# Patient Record
Sex: Male | Born: 1995 | Race: Black or African American | Hispanic: No | Marital: Single | Smoking: Never smoker
Health system: Southern US, Community
[De-identification: ages and names within clinical notes are randomized; demographics above are authoritative.]

## PROBLEM LIST (undated history)

## (undated) DIAGNOSIS — J45909 Unspecified asthma, uncomplicated: Secondary | ICD-10-CM

## (undated) DIAGNOSIS — S5321XA Traumatic rupture of right radial collateral ligament, initial encounter: Secondary | ICD-10-CM

---
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
## 2017-07-12 ENCOUNTER — Emergency Department (HOSPITAL_COMMUNITY)
Admission: EM | Admit: 2017-07-12 | Discharge: 2017-07-16 | Disposition: A | Discharge status: Other Acute Inpatient Hospital | Payer: BLUE CROSS/BLUE SHIELD | Attending: Emergency Medicine | Admitting: Emergency Medicine

## 2017-07-12 ENCOUNTER — Encounter (HOSPITAL_COMMUNITY): Payer: Self-pay | Admitting: Emergency Medicine

## 2017-07-12 DIAGNOSIS — R441 Visual hallucinations: Secondary | ICD-10-CM | POA: Diagnosis present

## 2017-07-12 DIAGNOSIS — J45909 Unspecified asthma, uncomplicated: Secondary | ICD-10-CM | POA: Insufficient documentation

## 2017-07-12 DIAGNOSIS — R443 Hallucinations, unspecified: Secondary | ICD-10-CM

## 2017-07-12 DIAGNOSIS — R440 Auditory hallucinations: Secondary | ICD-10-CM | POA: Diagnosis not present

## 2017-07-12 MED ORDER — ZIPRASIDONE MESYLATE 20 MG IM SOLR
20.0000 mg | Freq: Once | INTRAMUSCULAR | Status: AC
  Administered 2017-07-13: 20 mg via INTRAMUSCULAR
  Filled 2017-07-12: qty 20
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
## 2017-07-13 DIAGNOSIS — R441 Visual hallucinations: Secondary | ICD-10-CM | POA: Diagnosis not present

## 2017-07-13 LAB — COMPREHENSIVE METABOLIC PANEL
ALK PHOS: 78 U/L (ref 38–126)
ALT: 17 U/L (ref 17–63)
ANION GAP: 10 (ref 5–15)
AST: 29 U/L (ref 15–41)
Albumin: 4.3 g/dL (ref 3.5–5.0)
BUN: 8 mg/dL (ref 6–20)
CALCIUM: 9.3 mg/dL (ref 8.9–10.3)
CHLORIDE: 104 mmol/L (ref 101–111)
CO2: 23 mmol/L (ref 22–32)
CREATININE: 1.04 mg/dL (ref 0.61–1.24)
Glucose, Bld: 93 mg/dL (ref 65–99)
Potassium: 3.6 mmol/L (ref 3.5–5.1)
SODIUM: 137 mmol/L (ref 135–145)
Total Bilirubin: 0.8 mg/dL (ref 0.3–1.2)
Total Protein: 7.8 g/dL (ref 6.5–8.1)

## 2017-07-13 LAB — CBC
HCT: 39 % (ref 39.0–52.0)
Hemoglobin: 13 g/dL (ref 13.0–17.0)
MCH: 29 pg (ref 26.0–34.0)
MCHC: 33.3 g/dL (ref 30.0–36.0)
MCV: 87.1 fL (ref 78.0–100.0)
PLATELETS: 298 10*3/uL (ref 150–400)
RBC: 4.48 MIL/uL (ref 4.22–5.81)
RDW: 14.7 % (ref 11.5–15.5)
WBC: 9.7 10*3/uL (ref 4.0–10.5)

## 2017-07-13 LAB — RAPID URINE DRUG SCREEN, HOSP PERFORMED
Amphetamines: NOT DETECTED
Barbiturates: NOT DETECTED
Benzodiazepines: NOT DETECTED
COCAINE: NOT DETECTED
OPIATES: NOT DETECTED
Tetrahydrocannabinol: NOT DETECTED

## 2017-07-13 LAB — SALICYLATE LEVEL

## 2017-07-13 LAB — ETHANOL: ALCOHOL ETHYL (B): 108 mg/dL — AB (ref <5)

## 2017-07-13 LAB — ACETAMINOPHEN LEVEL: Acetaminophen (Tylenol), Serum: <10 ug/mL — ABNORMAL LOW (ref 10–30)

## 2017-07-13 MED ORDER — LORAZEPAM 1 MG PO TABS
1.0000 mg | ORAL_TABLET | ORAL | Status: AC | PRN
  Administered 2017-07-15: 1 mg via ORAL
  Filled 2017-07-13: qty 1

## 2017-07-13 MED ORDER — ZIPRASIDONE MESYLATE 20 MG IM SOLR: 20.0000 mg | Freq: Two times a day (BID) | INTRAMUSCULAR | Status: DC | PRN

## 2017-07-13 MED ORDER — STERILE WATER FOR INJECTION IJ SOLN
INTRAMUSCULAR | Status: AC
  Administered 2017-07-13: 07:00:00
  Filled 2017-07-13: qty 10

## 2017-07-13 NOTE — ED Notes (Signed)
Hand cuffs removed. Pt remains calm, mother at bedside along with sitter

## 2017-07-13 NOTE — Progress Notes (Signed)
One shirt  One pants  A pair of white socks

## 2017-07-13 NOTE — ED Provider Notes (Signed)
[facility]-EMERGENCY DEPT Provider Note   CSN: [id number] Arrival date & time: 07/12/17  2245     History   Chief Complaint Chief Complaint  Patient presents with  . Hallucinations    HPI Bobby [name] is a 21 y.o. male.  Patient presents to the emergency department with chief complaint of auditory and visual hallucinations. He is accompanied by his mother, who states that he has also had erratic behavior and has been destructive home. She states that he claims that he sees the devil and speaks with persons that are not present. She reports associated drug use, but states that his symptoms seem to be coming and going much more frequently without taking drugs. She is concerned for his safety and for the safety of others.  Level 5 caveat applies.   The history is provided by the patient. No language interpreter was used.    Past Medical History:  Diagnosis Date  . Asthma    prn inhaler  . Rupture of radial collateral ligament of right thumb 11/2015    There are no active problems to display for this patient.   Past Surgical History:  Procedure Laterality Date  . LIGAMENT REPAIR Right 12/15/2015   Procedure: RIGHT THUMB RADIAL COLLATERAL LIGAMENT REPAIR;  Surgeon: Betha LoaKevin Kuzma, MD;  Location: [facility];  Service: Orthopedics;  Laterality: Right;       Home Medications    Prior to Admission medications   Medication Sig Start Date End Date Taking? Authorizing Provider  albuterol (PROVENTIL HFA;VENTOLIN HFA) 108 (90 BASE) MCG/ACT inhaler Inhale into the lungs every 6 (six) hours as needed for wheezing or shortness of breath.    [provider]  HYDROcodone-acetaminophen [facility](NORCO) 5-325 MG tablet 1-2 tabs po q6 hours prn pain 12/15/15   Betha LoaKuzma, Kevin, MD    Family History No family history on file.  Social History Social History  Substance Use Topics  . Smoking status: Never Smoker  . Smokeless tobacco: Never Used  . Alcohol use No      Allergies   Patient has no known allergies.   Review of Systems Review of Systems  Unable to perform ROS: Psychiatric disorder     Physical Exam Updated Vital Signs BP 119/73 (BP Location: Right Arm)   Pulse 79   Temp 98.1 F (36.7 C) (Oral)   Resp 18   Wt 67.1 kg (148 lb)   SpO2 99%   Physical Exam  Constitutional: He is oriented to person, place, and time. He appears well-developed and well-nourished.  HENT:  Head: Normocephalic and atraumatic.  Eyes: Pupils are equal, round, and reactive to light. Conjunctivae and EOM are normal. Right eye exhibits no discharge. Left eye exhibits no discharge. No scleral icterus.  Neck: Normal range of motion. Neck supple. No JVD present.  Cardiovascular: Normal rate, regular rhythm and normal heart sounds.  Exam reveals no gallop and no friction rub.   No murmur heard. Pulmonary/Chest: Effort normal and breath sounds normal. No respiratory distress. He has no wheezes. He has no rales. He exhibits no tenderness.  Abdominal: Soft. He exhibits no distension and no mass. There is no tenderness. There is no rebound and no guarding.  Musculoskeletal: Normal range of motion. He exhibits no edema or tenderness.  Neurological: He is alert and oriented to person, place, and time.  Skin: Skin is warm and dry.  Psychiatric: He has a normal mood and affect. His behavior is normal. Judgment and thought content normal.  Nursing note  and vitals reviewed.    ED Treatments / Results  Labs (all labs ordered are listed, but only abnormal results are displayed) Labs Reviewed  ETHANOL - Abnormal; Notable for the following:       Result Value   Alcohol, Ethyl (B) 108 (*)    All other components within normal limits  ACETAMINOPHEN LEVEL - Abnormal; Notable for the following:    Acetaminophen (Tylenol), Serum <10 (*)    All other components within normal limits  COMPREHENSIVE METABOLIC PANEL  SALICYLATE LEVEL  CBC  RAPID URINE DRUG SCREEN, HOSP  PERFORMED    EKG  EKG Interpretation None       Radiology No results found.  Procedures Procedures (including critical care time)  Medications Ordered in ED Medications  sterile water (preservative free) injection (not administered)  ziprasidone (GEODON) injection 20 mg (20 mg Intramuscular Given 07/13/17 0005)     Initial Impression / Assessment and Plan / ED Course  I have reviewed the triage vital signs and the nursing notes.  Pertinent labs & imaging results that were available during my care of the patient were reviewed by me and considered in my medical decision making (see chart for details).     Patient with auditory and visual hallucinations. Aggressive and hostile gestures made at home. Patient under IVC.  Inpatient treatment is recommended. Awaiting placement. Patient is medically clear.  Final Clinical Impressions(s) / ED Diagnoses   Final diagnoses:  Hallucinations    New Prescriptions New Prescriptions   No medications on file     Roxy Horseman, Cordelia Poche 07/13/17 0603    Melene Plan, DO 07/13/17 [phone]
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
## 2017-07-13 NOTE — ED Notes (Signed)
IVC paperwork faxed and accepted.

## 2017-07-13 NOTE — ED Notes (Signed)
All personal belongings went home with mother.
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
## 2017-07-13 NOTE — ED Notes (Signed)
Pt changing into burgundy scrubs 

## 2017-07-13 NOTE — ED Notes (Signed)
TTS in progress 

## 2017-07-13 NOTE — BH Assessment (Signed)
[facility]. Representative was [employer].
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
## 2017-07-13 NOTE — ED Notes (Signed)
Pt now awake - Malawiturkey sandwich, apple sauce, and apple juice given as requested. Lunch order request received.

## 2017-07-13 NOTE — ED Notes (Signed)
Pt's mother, Lauree Chandlerina Arrick, called checking on pt. Advised her pt is sleeping. She stated pt had "not slept in a while, so that's good". Advised she plans to visit at 1230.

## 2017-07-13 NOTE — ED Notes (Signed)
Parents visiting w/pt. 

## 2017-07-13 NOTE — ED Notes (Signed)
Pt asking if he is going home today d/t states he wants to go home. Advised pt of tx plan - meets inpt criteria and [facility]. Also advised him his mother called and advised she is planning to visit w/pt at 1230. Pt voiced understanding and agreement w/tx plan.

## 2017-07-13 NOTE — BH Assessment (Signed)
Referral for placement has been submitted to the following facilities:   [facility]  [facility]  [facility]  [facility]  [facility]  [facility]

## 2017-07-13 NOTE — ED Notes (Signed)
Pt walking out of room towards lobby, RN tried to talk the pt into walking back to room but pt started yelling "I need my momma" "where is my momma". Pt continued to walk to lobby, security called and [employer] followed pt into lobby to stop him from leaving. Pt screaming and cursing in [facility] at [facility]. GPd tackled the pt to the ground and security help escort the pt back into his room.
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
## 2017-07-14 DIAGNOSIS — R441 Visual hallucinations: Secondary | ICD-10-CM | POA: Diagnosis not present

## 2017-07-14 MED ORDER — FLUOXETINE HCL 20 MG PO CAPS
20.0000 mg | ORAL_CAPSULE | Freq: Two times a day (BID) | ORAL | Status: DC
  Administered 2017-07-14 – 2017-07-16 (×4): 20 mg via ORAL
  Filled 2017-07-14 (×4): qty 1

## 2017-07-14 MED ORDER — CARBAMAZEPINE 200 MG PO TABS
200.0000 mg | ORAL_TABLET | Freq: Two times a day (BID) | ORAL | Status: DC
  Administered 2017-07-14 – 2017-07-16 (×4): 200 mg via ORAL
  Filled 2017-07-14 (×4): qty 1

## 2017-07-14 NOTE — BHH Counselor (Signed)
[facility], pt has been declined b/c they don't take pts under IVC.  Evette Cristalaroline Paige Noma Quijas, Kentucky[place]CS[place] Therapeutic Triage Specialist

## 2017-07-14 NOTE — ED Notes (Signed)
Pt's parents leaving at this time. Voiced appreciation of staff and [facility] and are hopeful pt will placed in facility that will help him.
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
## 2017-07-14 NOTE — Progress Notes (Signed)
Patient has been calm and cooperative since he's been awake, very appropriate, per [facility] RN Beckie.  Per [facility] RN Ledon SnareBeckie, patient has been very remorseful regarding his aggressive behavior last night.   CSW in disposition to continue to seek placement for patient.  [facility] at capacity at the moment.  Bobby [name], LCSWA Disposition staff 07/14/2017 11:33 AM

## 2017-07-14 NOTE — Progress Notes (Signed)
Patient's referral was followed up at the following inpatient treatment facilities:  [facility].  [facility] - same as above.  [facility] - same as above.  [facility] - referrals being revied.  [facility] - declined due acuity per Ron.  [facility] - due acuity, per Sloven[place].  Patient has been referred today to the following inpatient treatment hospitals: [facility], [facility], [facility], [facility].  At capacity: [facility], [address] [facility], [facility], [facility], [facility], and [facility].  CSW in disposition will continue to follow up and seek placement.  Melbourne Abtsatia Mearle Drew, LCSWA Disposition staff 07/14/2017 9:51 AM

## 2017-07-14 NOTE — ED Notes (Signed)
Pt asking if he will talk w/[facility] Counselor today. Pt aware [facility] seeking inpt placement. Advised pt should be re-assessed some time today. Voiced understanding and voiced understanding that he will be going inpt rather than home - also that meds will be started [facility].

## 2017-07-14 NOTE — ED Notes (Signed)
Re-TTS being performed.  

## 2017-07-14 NOTE — ED Notes (Signed)
Pt ambulatory to nurses' desk attempted to make phone calls - no one answered.

## 2017-07-14 NOTE — ED Notes (Signed)
Parents visiting w/pt. 

## 2017-07-14 NOTE — ED Notes (Signed)
Pt woke - ate breakfast - then returned to sleeping.  

## 2017-07-14 NOTE — Consult Note (Signed)
Pt is under review for inpatient psychiatric admission at multiple facilities. Upon chart review this writer would recommend the following medications for depression and mood stabilization. Pt has been having increasing agitation with outbursts over the past few months.   The following medication recommendations will be placed on Pt's chart by psychiatry:  Tegretol 200 mg PO BID Prozac 20 mg PO BID   Dorise HissLaurie Britton [place], VermontFNP-C 07/14/2017   [phone]

## 2017-07-15 DIAGNOSIS — R441 Visual hallucinations: Secondary | ICD-10-CM | POA: Diagnosis not present

## 2017-07-15 NOTE — BHH Counselor (Signed)
Pt is cooperative during reassessment. He reports he slept fairly well and his appetite this am is "a little off". Pt denies SI. He endorses HI. Pt says, "I have feelings in my head." Pt endorses [facility]. He has difficulty explaining the [facility]. He then says, "I just see stuff". When writer asks if pt thinks there are people out to get him, pt says, "Yes". He says, "I just hear voices in my head." Pt continues to meet inpatient criteria and TTS will continue to seek inpatient placement.  Evette Cristalaroline Paige Essie Lagunes, Kentucky[place]CSW Therapeutic Triage Specialist

## 2017-07-15 NOTE — ED Triage Notes (Signed)
TTS done 

## 2017-07-16 ENCOUNTER — Inpatient Hospital Stay (HOSPITAL_COMMUNITY)
Admission: EM | Admit: 2017-07-16 | Discharge: 2017-07-23 | DRG: 885 | Disposition: A | Discharge status: Home / Self Care | Payer: 59 | Source: Intra-hospital | Attending: Psychiatry | Admitting: Psychiatry

## 2017-07-16 ENCOUNTER — Encounter (HOSPITAL_COMMUNITY): Payer: Self-pay | Admitting: *Deleted

## 2017-07-16 DIAGNOSIS — G4700 Insomnia, unspecified: Secondary | ICD-10-CM | POA: Diagnosis present

## 2017-07-16 DIAGNOSIS — Z79899 Other long term (current) drug therapy: Secondary | ICD-10-CM

## 2017-07-16 DIAGNOSIS — Z818 Family history of other mental and behavioral disorders: Secondary | ICD-10-CM

## 2017-07-16 DIAGNOSIS — J45909 Unspecified asthma, uncomplicated: Secondary | ICD-10-CM | POA: Diagnosis present

## 2017-07-16 DIAGNOSIS — F333 Major depressive disorder, recurrent, severe with psychotic symptoms: Principal | ICD-10-CM | POA: Diagnosis present

## 2017-07-16 DIAGNOSIS — F203 Undifferentiated schizophrenia: Secondary | ICD-10-CM | POA: Diagnosis not present

## 2017-07-16 DIAGNOSIS — R441 Visual hallucinations: Secondary | ICD-10-CM | POA: Diagnosis not present

## 2017-07-16 DIAGNOSIS — F39 Unspecified mood [affective] disorder: Secondary | ICD-10-CM | POA: Diagnosis not present

## 2017-07-16 DIAGNOSIS — F17210 Nicotine dependence, cigarettes, uncomplicated: Secondary | ICD-10-CM | POA: Diagnosis present

## 2017-07-16 DIAGNOSIS — F419 Anxiety disorder, unspecified: Secondary | ICD-10-CM | POA: Diagnosis not present

## 2017-07-16 DIAGNOSIS — F1290 Cannabis use, unspecified, uncomplicated: Secondary | ICD-10-CM | POA: Diagnosis not present

## 2017-07-16 MED ORDER — ALUM & MAG HYDROXIDE-SIMETH 200-200-20 MG/5ML PO SUSP: 30.0000 mL | ORAL | Status: DC | PRN

## 2017-07-16 MED ORDER — ACETAMINOPHEN 325 MG PO TABS: 650.0000 mg | ORAL_TABLET | Freq: Four times a day (QID) | ORAL | Status: DC | PRN

## 2017-07-16 MED ORDER — FLUOXETINE HCL 20 MG PO CAPS
20.0000 mg | ORAL_CAPSULE | Freq: Two times a day (BID) | ORAL | Status: DC
  Administered 2017-07-16 – 2017-07-18 (×4): 20 mg via ORAL
  Filled 2017-07-16 (×8): qty 1

## 2017-07-16 MED ORDER — QUETIAPINE FUMARATE 50 MG PO TABS
50.0000 mg | ORAL_TABLET | Freq: Two times a day (BID) | ORAL | Status: DC
  Administered 2017-07-16 – 2017-07-17 (×2): 50 mg via ORAL
  Filled 2017-07-16 (×8): qty 1

## 2017-07-16 MED ORDER — ZIPRASIDONE MESYLATE 20 MG IM SOLR: 20.0000 mg | INTRAMUSCULAR | Status: DC | PRN

## 2017-07-16 MED ORDER — TRAZODONE HCL 50 MG PO TABS
50.0000 mg | ORAL_TABLET | Freq: Every evening | ORAL | Status: DC | PRN
Start: 2017-07-16 — End: 2017-07-23
  Administered 2017-07-16: 50 mg via ORAL
  Filled 2017-07-16: qty 1

## 2017-07-16 MED ORDER — OLANZAPINE 5 MG PO TBDP
5.0000 mg | ORAL_TABLET | Freq: Three times a day (TID) | ORAL | Status: DC | PRN
  Administered 2017-07-17 – 2017-07-18 (×2): 5 mg via ORAL
  Filled 2017-07-16 (×2): qty 1

## 2017-07-16 MED ORDER — LORAZEPAM 1 MG PO TABS: 1.0000 mg | ORAL_TABLET | ORAL | Status: DC | PRN

## 2017-07-16 MED ORDER — CARBAMAZEPINE 200 MG PO TABS
200.0000 mg | ORAL_TABLET | Freq: Two times a day (BID) | ORAL | Status: DC
  Administered 2017-07-16 – 2017-07-23 (×14): 200 mg via ORAL
  Filled 2017-07-16 (×21): qty 1

## 2017-07-16 MED ORDER — MAGNESIUM HYDROXIDE 400 MG/5ML PO SUSP: 30.0000 mL | Freq: Every day | ORAL | Status: DC | PRN

## 2017-07-16 MED ORDER — HYDROXYZINE HCL 25 MG PO TABS
25.0000 mg | ORAL_TABLET | Freq: Three times a day (TID) | ORAL | Status: DC | PRN
  Administered 2017-07-17 – 2017-07-18 (×2): 25 mg via ORAL
  Filled 2017-07-16 (×2): qty 1
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
## 2017-07-16 NOTE — ED Notes (Signed)
PT family wanded by security and in room visiting pt at this time. PT is calm and cooperative. Family seems to be very concerned and involved.

## 2017-07-16 NOTE — Progress Notes (Addendum)
[facility] Bed [facility]1 Nira ConnJason Berry, NP is the accepting provider Dr. Jomarie LongsSaramma Eappen is the attending provider Call report to [phone] Pt is IVC'd Pt may arrive any time after 8:30 PM.  Carney BernJean T. Kaylyn LimSutter, MSW, LCSWA Disposition Clinical Social Work [phone] (cell) [phone] (office)

## 2017-07-16 NOTE — Tx Team (Signed)
Initial Treatment Plan 07/16/2017 10:51 PM Bobby Hissiles Q Gieger BJY:[id number]    PATIENT STRESSORS: Marital or family conflict Occupational concerns   PATIENT STRENGTHS: Average or above average intelligence Communication skills Physical Health Supportive family/friends   PATIENT IDENTIFIED PROBLEMS: Psychosis  Others directed violence  "Calming my anger"  "I hear voices but they don't bother me"               DISCHARGE CRITERIA:  Ability to meet basic life and health needs Improved stabilization in mood, thinking, and/or behavior Motivation to continue treatment in a less acute level of care Need for constant or close observation no longer present Verbal commitment to aftercare and medication compliance  PRELIMINARY DISCHARGE PLAN: Outpatient therapy Return to previous living arrangement Return to previous work or school arrangements  PATIENT/FAMILY INVOLVEMENT: This treatment plan has been presented to and reviewed with the patient, Bobby [name], and/or family member.  The patient and family have been given the opportunity to ask questions and make suggestions.  Carleene OverlieMiddleton, Kameria Canizares P, RN 07/16/2017, 10:51 PM

## 2017-07-16 NOTE — ED Notes (Signed)
[facility]

## 2017-07-16 NOTE — ED Notes (Signed)
Pt resting in bed with yes closed PT NAD

## 2017-07-16 NOTE — ED Notes (Signed)
Snack given and lunch order patient is resting

## 2017-07-16 NOTE — BH Assessment (Signed)
[facility] Assessment Progress Note  Pt reassessed this afternoon. Pt continues to report AH. He says "I feel like they're trying to get into my head". Pt also reports agitation all day and moodiness. Pt reports this is not his normal persona. IP treatment still recommended.   Bobby [name] Ridgelaylor, MS, NCC, LPCA Counselor

## 2017-07-17 DIAGNOSIS — F333 Major depressive disorder, recurrent, severe with psychotic symptoms: Principal | ICD-10-CM

## 2017-07-17 LAB — LIPID PANEL
CHOL/HDL RATIO: 2.3 ratio
Cholesterol: 164 mg/dL (ref 0–200)
HDL: 72 mg/dL (ref >40)
LDL Cholesterol: 69 mg/dL (ref 0–99)
Triglycerides: 115 mg/dL (ref <150)
VLDL: 23 mg/dL (ref 0–40)

## 2017-07-17 LAB — TSH: TSH: 1.333 u[IU]/mL (ref 0.350–4.500)

## 2017-07-17 MED ORDER — QUETIAPINE FUMARATE 100 MG PO TABS
100.0000 mg | ORAL_TABLET | Freq: Every day | ORAL | Status: DC
  Administered 2017-07-18: 100 mg via ORAL
  Filled 2017-07-17 (×2): qty 1
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
## 2017-07-17 NOTE — Progress Notes (Signed)
Recreation Therapy Notes  INPATIENT RECREATION THERAPY ASSESSMENT  Patient Details Name: Bobby [name] MRN: [id number] DOB: December 26, 1995 Today's Date: 07/17/2017  Patient Stressors:  Marland Kitchen("I don't stress".)  Pt stated he was here because "I slept".  Coping Skills:   Isolate, Talking, Music, Sports  Personal Challenges: Anger, Restaurant manager, fast foodocial Interaction, Work Nutritional therapisterformance  Leisure Interests (2+):  Sports - [employer]asketball, Sports - Football  Biochemist, clinicalAwareness of Community Resources:  Yes  Community Resources:  [place], [facility], [employer]  Current Use: No  If no, Barriers?:  ("I grown")  Patient Strengths:  Good at basketball; communication  Patient Identified Areas of Improvement:  Anger  Current Recreation Participation:  Not often  Patient Goal for Hospitalization:  "Work on anger"  [place]  [place] Residence:  [place]  Current [place] (including self-harm):  No  Current HI:  No  Consent to Intern Participation: N/[name]   Bobby RancherMarjette Luman [name], Bobby [name]  Bobby AbedLindsay, Bobby [name] 07/17/2017, 11:06 AM
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
## 2017-07-17 NOTE — Progress Notes (Signed)
Adult Psychoeducational Group Note  Date:  07/17/2017 Time:  6:48 PM  Group Topic/Focus:  healthy supports  Participation Level:  Minimal  Participation Quality:  Appropriate and Attentive  Affect:  inappropriately smiling at times, but engaged when involved in discussion  Cognitive:  Alert  Insight: Improving  Engagement in Group:  Developing/Improving  Modes of Intervention:  Discussion and Education  Additional Comments:  Pt. Shared that he thought of an olympian as role model,  Due to his diligence and hardwork.   Delila PereyraMichels, Akeyla Molden Louise 07/17/2017, 6:48 PM

## 2017-07-17 NOTE — [facility] Group Notes (Signed)
[facility] LCSW Group Therapy  07/17/2017 2:23 PM  Type of Therapy:  Group Therapy  Participation Level:  Active  Participation Quality:  Appropriate and Sharing  Affect:  Appropriate  Cognitive:  Appropriate  Insight:  Developing/Improving  Engagement in Therapy:  Engaged  Modes of Intervention:  Activity, Discussion, Socialization and Support  Summary of Progress/Problems: CSW started off group with an ice breaker in order to get each participant active and alert. Each participant introduced self and thought about one word that describes them with the first letter of their name. Bobby [name] mentioned that he is quick. Group participants were then asked to give their own definition of what emotion regulation means to them. Talk about what emotion they find most difficult to manage and the physical symptoms they encounter. Group members were then asked to think about positive coping skills to regulate their emotions. Bobby [name] did a great job in participating. Patient mentioned that his anger is most difficult for him to handle and he is still working on ways to respond in a more positive way. CSW provided feedback and patient was receptive. No concerns to report at this time.   Bobby [name] 07/17/2017, 2:23 PM

## 2017-07-17 NOTE — Progress Notes (Signed)
D) Pt. Affect blunted and noted smiling inappropriately at times.  Pt. Reports he "hears demons", but that they "help me calm down".  Pt stated he would listen to the demons when the dayroom became loud and they would help him stay calm.  Pt. Reports "the voices aren't my problem, anger is".  Pt. Later asked when his "anger medication would be given".  Pt. Was asked if he was angry now, but stated "no, I'm just asking for later".  A) Pt. Offered support and educated about medication.  R) Pt. Receptive and cooperative. Noted pacing at times.

## 2017-07-17 NOTE — [facility] Counselor (Signed)
Adult Comprehensive Assessment  Patient ID: Bobby [name], male   DOB: 1996/12/11, 21 y.o.   MRN: [id number]  Information Source: Information source: Patient  Current Stressors:  Educational / Learning stressors: N/A Employment / Job issues: N/A Family Relationships: None reported by patient. Patient reports having a very supportive family Surveyor, quantityinancial / Lack of resources (include bankruptcy): N/A Housing / Lack of housing: N/A Physical health (include injuries & life threatening diseases): N/A Social relationships: Patient reports finding it hard to make friends Substance abuse: None reported  Bereavement / Loss: N/A  Living/Environment/Situation:  Living Arrangements: Parent, Other relatives Living conditions (as described by patient or guardian): Patient lives in the home with his mother, stepfather and 19 year old sister.  How long has patient lived in current situation?: Patient reports he has been living with his family for 6 years. Patient reports living with father in [place], staying with a friend in [place], and living with back and forth with girlfriend in [place] prior to moving back in with mother.  What is atmosphere in current home: Loving, Supportive  Family History:  Marital status: Single What is your sexual orientation?: Heterosexual; Interested in Women Has your sexual activity been affected by drugs, alcohol, medication, or emotional stress?: N/A Does patient have children?: No  Childhood History:  By whom was/is the patient raised?: Mother Additional childhood history information: Patient reports father left family when he was about 21 years old. Patient reports mother has always been supportive of patient and they have a good relationship. Patient reports some anger regarding father living them at a young age.  Description of patient's relationship with caregiver when they were a child: Patient reports having a good upbringing by mother.  Patient's description  of current relationship with people who raised him/her: Patient reports a good relationship with his mother.  How were you disciplined when you got in trouble as a child/adolescent?: Unable to assess Does patient have siblings?: Yes Number of Siblings: 1 Description of patient's current relationship with siblings: Patient reports having an 19 year old sister. Patient reports they have a close relationship however his anger can get into the way of their relationship.  Did patient suffer any verbal/emotional/physical/sexual abuse as a child?: No Did patient suffer from severe childhood neglect?: No Has patient ever been sexually abused/assaulted/raped as an adolescent or adult?: No Was the patient ever a victim of a crime or a disaster?: No Witnessed domestic violence?: No Has patient been effected by domestic violence as an adult?: No  Education:  Highest grade of school patient has completed: 12th Currently a student?: No Name of school: N/A Learning disability?: No  Employment/Work Situation:   Employment situation: Employed Where is patient currently employed?: Patient currently works at [employer] How long has patient been employed?: Patient reports he has been working at [employer] for 3 months  Patient's job has been impacted by current illness: No What is the longest time patient has a held a job?: 3 months Where was the patient employed at that time?: [employer] 3 months  Has patient ever been in the Eli Lilly and Companymilitary?: No Has patient ever served in combat?: No Did You Receive Any Psychiatric Treatment/Services While in Equities traderthe Military?: No Are There Guns or Other Weapons in Your Home?: No Are These Weapons Safely Secured?: Yes  Financial Resources:   Surveyor, quantityinancial resources: [employer] insurance, Income from employment Does patient have a Lawyerrepresentative payee or guardian?: No  Alcohol/Substance Abuse:   If attempted suicide, did drugs/alcohol play a role  in this?: No Alcohol/Substance Abuse Treatment  Hx: Denies past history Has alcohol/substance abuse ever caused legal problems?: No  Social Support System:   Patient's Community Support System: Good Describe Community Support System: Patient reports he has good family support.  Type of faith/religion: Christianity How does patient's faith help to cope with current illness?: When asked this question, patient reported "he plays basketball".   Leisure/Recreation:   Leisure and Hobbies: Patient reports he loves to play basketball and football.   Strengths/Needs:   What things does the patient do well?: Patient reports he is very athletic. Patient reports he is also very good at math and reading.  In what areas does patient struggle / problems for patient: Patient reports he has a hard time maintaining friendships.  Discharge Plan:   Does patient have access to transportation?: Yes Will patient be returning to same living situation after discharge?: Yes Currently receiving community mental health services: No If no, would patient like referral for services when discharged?: Yes (What county?) [facility] Does patient have financial barriers related to discharge medications?: No  Summary/Recommendations:   Summary and Recommendations (to be completed by the evaluator): Patient is a 21 year old male who reports being admitted into the hospital for aggression and anger. Patient reports getting into an argument with sister which caused him to "snap". Patient reports having a hard time controlling his anger outburst and finds it hurt to identify his triggers for anger.   Bobby [name]. 07/17/2017

## 2017-07-17 NOTE — [facility] Suicide Risk Assessment (Addendum)
[facility]   Nursing information obtained from:  Patient Demographic factors:  Male, Adolescent or young adult, Unemployed Current Mental Status:  NA Loss Factors:  NA Historical Factors:  NA Risk Reduction Factors:  Living with another person, especially a relative, Positive social support  Total Time spent with patient: 1 hour Principal Problem: MDD (major depressive disorder), recurrent, severe, with psychosis (HCC) Diagnosis:   Patient Active Problem List   Diagnosis Date Noted  . MDD (major depressive disorder), recurrent, severe, with psychosis (HCC) [F33.3] 07/16/2017    Priority: High   Subjective Data: Patient is a 21 year old male [facility] and treatment of agitation and aggressive behaviors. Patient reports that he got upset with his sister, got agitated, took the hinges off the door and states that he does not remember what happened and how he got agitated. Patient has that he's been having Auditory and visual hallucinations, gets agitated easily, and family reports that patient has made threats towards mother and sister. Patient also destroyed property at home.  Continued Clinical Symptoms:  Alcohol Use Disorder Identification Test Final Score (AUDIT): 0 The "Alcohol Use Disorders Identification Test", Guidelines for Use in Primary Care, Second Edition.  World Science writerHealth Organization [facility](WHO). Score between 0-7:  no or low risk or alcohol related problems. Score between 8-15:  moderate risk of alcohol related problems. Score between 16-19:  high risk of alcohol related problems. Score 20 or above:  warrants further diagnostic evaluation for alcohol dependence and treatment.   CLINICAL FACTORS:   Severe Anxiety and/or Agitation Depression:   Aggression Delusional Impulsivity Currently Psychotic Unstable or Poor Therapeutic Relationship   Musculoskeletal: Strength & Muscle Tone: within normal  limits Gait & Station: normal Patient leans: N/A  Psychiatric Specialty Exam: Physical Exam  ROS  Blood pressure 128/80, pulse 92, temperature 98.1 F (36.7 C), temperature source Oral, resp. rate 20, height 5\' 9"  (1.753 m), weight 67.1 kg (148 lb).Body mass index is 21.86 kg/m.  General Appearance: Disheveled  Eye Contact:  Fair  Speech:  Blocked  Volume:  Decreased  Mood:  Depressed, Dysphoric and Hopeless  Affect:  Congruent and Restricted  Thought Process:  Linear and Descriptions of Associations: Loose  Orientation:  Full (Time, Place, and Person)  Thought Content:  Hallucinations: Auditory and Rumination  Suicidal Thoughts:  No  Homicidal Thoughts:  No  Memory:  Immediate;   Fair Recent;   Fair Remote;   Fair  Judgement:  Impaired  Insight:  Shallow  Psychomotor Activity:  Mannerisms  Concentration:  Concentration: Fair and Attention Span: Fair  Recall:  [employer]  Fund of Knowledge:  Fair  Language:  Fair  Akathisia:  No  Handed:  Right  AIMS (if indicated):     Assets:  Desire for Improvement Housing Physical Health Social Support  ADL's:  Impaired  Cognition:  Impaired,  Mild  Sleep:  Number of Hours: 4.75      COGNITIVE FEATURES THAT CONTRIBUTE TO RISK:  Loss of executive function    SUICIDE RISK:   Minimal: No identifiable suicidal ideation.  Patients presenting with no risk factors but with morbid ruminations; may be classified as minimal risk based on the severity of the depressive symptoms  PLAN OF CARE: Patient's time Prozac 20 mg daily to help with his depression and Seroquel 100 mg at bedtime to help with mood stabilization and impulse control. Also patient on agitation protocol due to his history of aggression. Patient  also on Tegretol 200 mg twice daily to help with mood stabilization aggression.   I certify that inpatient services furnished can reasonably be expected to improve the patient's condition.   Nelly RoutKUMAR,Beckem Tomberlin, MD 07/17/2017, 3:45 PM

## 2017-07-17 NOTE — Progress Notes (Signed)
Recreation Therapy Notes  Date: 07/17/2017         Time: 10:00am Location: [address] Dayroom  Group Topic: Self-Esteem  Goal Area(s) Addresses:  Pt will be able to successfully create a piece of artwork that shows others who they are. Pt will be able to successfully share their license plate with peers.  Behavioral Response: Engaged  Intervention: Art  Activity: Pt will be given a blank license plate template worksheet. Pt will be asked to create a license plate that represents who they are. Pt will then share their license plate with peers.  Education:Self-Esteem, Discharge Planning  Education Outcome: Acknowledges understanding  Clinical Observations/Feedback: Pt actively participated in activity. Pt identified his unique traits as he hears voices in his head, he likes money and he is religious. Pt left group before processing discussion began and did not return to group.  Marvell Fullerachel Meyer, Recreation Therapy Intern  Caroll RancherMarjette Josepha Barbier, LRT/CTRS

## 2017-07-17 NOTE — Plan of Care (Signed)
Problem: Coping: Goal: Ability to verbalize frustrations and anger appropriately will improve Outcome: Progressing Pt. Asking about anger medication.

## 2017-07-17 NOTE — Tx Team (Signed)
Interdisciplinary Treatment and Diagnostic Plan Update  07/17/2017 Time of Session: 9:30am Bobby [name] MRN: [id number]  Principal Diagnosis: major depressive disorder, recurrent, severe with psychosis  Secondary Diagnoses: Active Problems:   MDD (major depressive disorder), recurrent, severe, with psychosis ([place])   Current Medications:  Current Facility-Administered Medications  Medication Dose Route Frequency Provider Last Rate Last Dose  . acetaminophen (TYLENOL) tablet 650 mg  650 mg Oral Q6H PRN Okonkwo, Justina A, NP      . alum & mag hydroxide-simeth (MAALOX/MYLANTA) 200-200-20 MG/5ML suspension 30 mL  30 mL Oral Q4H PRN Okonkwo, Justina A, NP      . carbamazepine (TEGRETOL) tablet 200 mg  200 mg Oral BID Okonkwo, Justina A, NP   200 mg at 07/17/17 0825  . FLUoxetine (PROZAC) capsule 20 mg  20 mg Oral BID Okonkwo, Justina A, NP   20 mg at 07/17/17 0825  . hydrOXYzine (ATARAX/VISTARIL) tablet 25 mg  25 mg Oral TID PRN Lu Duffel, Justina A, NP      . OLANZapine zydis (ZYPREXA) disintegrating tablet 5 mg  5 mg Oral Q8H PRN Laverle Hobby, PA-C       And  . LORazepam (ATIVAN) tablet 1 mg  1 mg Oral PRN Patriciaann Clan E, PA-C       And  . ziprasidone (GEODON) injection 20 mg  20 mg Intramuscular PRN Patriciaann Clan E, PA-C      . magnesium hydroxide (MILK OF MAGNESIA) suspension 30 mL  30 mL Oral Daily PRN Lu Duffel, Justina A, NP      . [START ON 07/18/2017] QUEtiapine (SEROQUEL) tablet 100 mg  100 mg Oral QHS Nwoko, Agnes I, NP      . traZODone (DESYREL) tablet 50 mg  50 mg Oral QHS PRN Okonkwo, Justina A, NP   50 mg at 07/16/17 2252    PTA Medications: Prescriptions Prior to Admission  Medication Sig Dispense Refill Last Dose  . albuterol (PROVENTIL HFA;VENTOLIN HFA) 108 (90 BASE) MCG/ACT inhaler Inhale 1-2 puffs into the lungs every 6 (six) hours as needed for wheezing or shortness of breath.    PRN  . HYDROcodone-acetaminophen (NORCO) 5-325 MG tablet 1-2 tabs po q6 hours prn  pain (Patient not taking: Reported on 07/13/2017) 30 tablet 0 Completed Course at Unknown time    Treatment Modalities: Medication Management, Group therapy, Case management,  1 to 1 session with clinician, Psychoeducation, Recreational therapy.  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 Physical Health Supportive family/friends  Physician Treatment Plan for Primary Diagnosis: major depressive disorder, recurrent, severe with psychosis Long Term Goal(s): Improvement in symptoms so as ready for discharge  Short Term Goals: Ability to identify changes in lifestyle to reduce recurrence of condition will improve Ability to verbalize feelings will improve Ability to demonstrate self-control will improve Ability to identify and develop effective coping behaviors will improve Compliance with prescribed medications will improve Ability to identify triggers associated with substance abuse/mental health issues will improve  Medication Management: Evaluate patient's response, side effects, and tolerance of medication regimen.  Therapeutic Interventions: 1 to 1 sessions, Unit Group sessions and Medication administration.  Evaluation of Outcomes: Not Met  Physician Treatment Plan for Secondary Diagnosis: Active Problems:   MDD (major depressive disorder), recurrent, severe, with psychosis ([place])   Long Term Goal(s): Improvement in symptoms so as ready for discharge  Short Term Goals: Ability to identify changes in lifestyle to reduce recurrence of condition will  improve Ability to verbalize feelings will improve Ability to demonstrate self-control will improve Ability to identify and develop effective coping behaviors will improve Compliance with prescribed medications will improve Ability to identify triggers associated with substance abuse/mental health issues will  improve  Medication Management: Evaluate patient's response, side effects, and tolerance of medication regimen.  Therapeutic Interventions: 1 to 1 sessions, Unit Group sessions and Medication administration.  Evaluation of Outcomes: Not Met   RN Treatment Plan for Primary Diagnosis: major depressive disorder, recurrent, severe with psychosis Long Term Goal(s): Knowledge of disease and therapeutic regimen to maintain health will improve  Short Term Goals: Ability to demonstrate self-control, Ability to verbalize feelings will improve and Ability to identify and develop effective coping behaviors will improve  Medication Management: RN will administer medications as ordered by provider, will assess and evaluate patient's response and provide education to patient for prescribed medication. RN will report any adverse and/or side effects to prescribing provider.  Therapeutic Interventions: 1 on 1 counseling sessions, Psychoeducation, Medication administration, Evaluate responses to treatment, Monitor vital signs and CBGs as ordered, Perform/monitor CIWA, COWS, AIMS and Fall Risk screenings as ordered, Perform wound care treatments as ordered.  Evaluation of Outcomes: Not Met   Recreational Therapy Treatment Plan for Primary Diagnosis: MDD (major depressive disorder), recurrent, severe, with psychosis ([place]) Long Term Goal(s): Patient will participate in recreation therapy treatment in at least 2 group sessions without prompting from LRT  Short Term Goals: Anger Management-Patient will be able to successfully recognize at least 2 triggers for anger and coping skills to address identified triggers  Treatment Modalities: Group and Pet Therapy  Therapeutic Interventions: Psychoeducation  Evaluation of Outcomes: Progressing   LCSW Treatment Plan for Primary Diagnosis: major depressive disorder, recurrent, severe with psychosis Long Term Goal(s): Safe transition to appropriate next level of care  at discharge, Engage patient in therapeutic group addressing interpersonal concerns.  Short Term Goals: Engage patient in aftercare planning with referrals and resources, Identify triggers associated with mental health/substance abuse issues and Increase skills for wellness and recovery  Therapeutic Interventions: Assess for all discharge needs, 1 to 1 time with Social worker, Explore available resources and support systems, Assess for adequacy in community support network, Educate family and significant other(s) on suicide prevention, Complete Psychosocial Assessment, Interpersonal group therapy.  Evaluation of Outcomes: Not Met   Progress in Treatment: Attending groups: Pt is new to milieu, continuing to assess  Participating in groups: Pt is new to milieu, continuing to assess  Taking medication as prescribed: Yes, MD continues to assess for medication changes as needed Toleration medication: Yes, no side effects reported at this time Family/Significant other contact made: Yes with mother Patient understands diagnosis: Continuing to assess Discussing patient identified problems/goals with staff: Yes Medical problems stabilized or resolved: Yes Denies suicidal/homicidal ideation: Yes Issues/concerns per patient self-inventory: None Other: N/A  New problem(s) identified: None identified at this time.   New Short Term/Long Term Goal(s): None identified at this time.   Discharge Plan or Barriers: Pt will return home and follow-up with outpatient services; referral made to the [facility]  Reason for Continuation of Hospitalization: Anxiety Hallucinations Medication stabilization  Estimated Length of Stay: 3-5 days; Est DC date 7/29  Attendees: Patient: 07/17/2017  2:50 PM  Physician: Dr. Dwyane Dee 07/17/2017  2:50 PM  Nursing: Coy Saunas; RN 07/17/2017  2:50 PM  RN Care Manager: Lars Pinks, RN 07/17/2017  2:50 PM  Social Worker: Adriana Reams, LCSW 07/17/2017  2:50 PM   Recreational  Therapist:  07/17/2017  2:50 PM  Other: Lindell Spar, NP; Marvia Pickles, NP 07/17/2017  2:50 PM  Other:  07/17/2017  2:50 PM  Other: 07/17/2017  2:50 PM    Scribe for Treatment Team: Gladstone Lighter, LCSW 07/17/2017 2:50 PM

## 2017-07-18 DIAGNOSIS — F203 Undifferentiated schizophrenia: Secondary | ICD-10-CM

## 2017-07-18 LAB — LIPID PANEL
CHOL/HDL RATIO: 2.1 ratio
Cholesterol: 161 mg/dL (ref 0–200)
HDL: 76 mg/dL (ref >40)
LDL CALC: 60 mg/dL (ref 0–99)
Triglycerides: 127 mg/dL (ref <150)
VLDL: 25 mg/dL (ref 0–40)

## 2017-07-18 LAB — PROLACTIN: Prolactin: 36.8 ng/mL — ABNORMAL HIGH (ref 4.0–15.2)

## 2017-07-18 LAB — HEMOGLOBIN A1C
Hgb A1c MFr Bld: 5.2 % (ref 4.8–5.6)
Mean Plasma Glucose: 103 mg/dL

## 2017-07-18 NOTE — Progress Notes (Signed)
Patient ID: Bobby [name], male   DOB: April 19, 1996, 21 y.o.   MRN: [id number]  DAR: Pt. Denies SI/HI and A/V Hallucinations. He reports sleep is good, appetite is good, energy level is normal, and concentration is good. He rates depression, hopelessness, and anxiety 0/10. He does report agitation and anxiety and received PRN medications for these complaints. Patient does not report any pain or discomfort at this time. Support and encouragement provided to the patient. Scheduled medications administered to patient per physician's orders. Patient is minimal with Clinical research associatewriter and appears to be minimally participating in the milieu this morning. As the morning progresses he is seen more in the dayroom but then retreats to his room. His affect is blunted and mood depressed. He does appear suspicious when receiving his medications but does take them. When receiving PRN Zyprexa this morning he kept sticking his tongue out and putting it back in his mouth until it was dissolved. Q15 minute checks are maintained for safety.

## 2017-07-18 NOTE — Progress Notes (Signed)
Adult Psychoeducational Group Note  Date:  07/18/2017 Time:  1:42 AM  Group Topic/Focus:  Wrap-Up Group:   The focus of this group is to help patients review their daily goal of treatment and discuss progress on daily workbooks.  Participation Level:  Active  Participation Quality:  Appropriate  Affect:  Appropriate  Cognitive:  Appropriate  Insight: Appropriate  Engagement in Group:  Engaged  Modes of Intervention:  Discussion  Additional Comments:  Patient stated his goal for today was to have a productive day and not to hear voices. Pt stated he achieved his goal today. Pt rated his overall day a 9. Pt stated he attended all groups held today. Pt stated his visited by his parents help improve his day.  Bobby FurnaceChristopher  Bonnetta [name] 07/18/2017, 1:42 AM

## 2017-07-18 NOTE — Progress Notes (Signed)
Recreation Therapy Notes  Date:07/18/2017 Time:10:00am Location:[address] Dayroom  Group Topic:Leisure Education  Goal Area(s) Addresses: Pt will be able to successfully identify leisure activities that they can do in their free time. Pt will be able to successfully identify community resources.  Intervention:Game  Activity:Pts played a game of Leisure Apples to Apples. Each pt was given 5 red cards that had activities, people and places on them. Each pt took a turn taking a green card that had topics related to leisure such as "summer activities," " someone you would play basketball with." Pts would then give a red card to try and match the green card with the best answer. For example, if someone had a green card that said, "a person to play basketball with," someone may give a red card that say, "Lebron James." Whoever finished the game with the most green cards won.  Education:Leisure Education, Discharge Planning  Education Outcome:Acknowledges understanding   Clinical Observations/Feedback:Pt did not attend group.  Rachel Meyer, Recreation Therapy Intern   Tenesia Escudero, LRT/CTRS 

## 2017-07-18 NOTE — Progress Notes (Addendum)
Nursing Progress Note: 7p-7a D: Pt currently presents with a depressed/anxious/preoccuppied affect and behavior. Pt states "I just want information on my diagnosis. I don't really understand what is going on in my head. Sometimes I just get angry and I can't control my fighting. [place] happening [place] Interacting appropriately with milieu. Pt reports good sleep during the previous night with current medication regimen.   A: Pt provided with medications per providers orders. Pt's labs and vitals were monitored throughout the night. Pt supported emotionally and encouraged to express concerns and questions. Pt educated on medications.  R: Pt's safety ensured with 15 minute and environmental checks. Pt currently denies SI, HI, and AVH. Pt verbally contracts to seek staff if SI,HI, or AVH occurs and to consult with staff before acting on any harmful thoughts. Will continue to monitor.

## 2017-07-18 NOTE — Progress Notes (Signed)
[facility] Progress Note  07/18/2017 1:08 PM Bobby [name]  MRN:  [id number]  Subjective: Bobby [name] reports, "I'm doing okay, just feeling tired".  Objective: Bobby [name] is seen, chart reviewed. His case, discussed with the treatment team. He is seen lying down in his bed sleeping. He is easily aroused, but presents sluggishly. He says he is okay, but feeling tired. His speech is sofft, but clear. Staff reports that Bobby [name] is visible on the unit. He continues to report auditory hallucinations, but denies visual hallucinations today. He continues to present with some bizarre behavior, like he will momentarily lose focus on what is going while appearing to be responding to some internal stimuli. Staff reports no disruptive behavior on the unit. Bobby [name] is cooperative with staff, taking his medications without any adverse effects or reactions reported. He is instructed & encouraged to participate in the group counseling sessions/activities being offered & held on this unit to learn coping skills. His medication regimen is currently being adjusted to meet his need.  Principal Problem: Undifferentiated schizophrenia (HCC)  Diagnosis:   Patient Active Problem List   Diagnosis Date Noted  . Undifferentiated schizophrenia (HCC) [F20.3] 07/18/2017    Priority: High  . MDD (major depressive disorder), recurrent, severe, with psychosis (HCC) [F33.3] 07/16/2017   Total Time spent with patient: 25 minutes  Past Psychiatric History: Anger issues  Past Medical History:  Past Medical History:  Diagnosis Date  . Asthma    prn inhaler  . Rupture of radial collateral ligament of right thumb 11/2015    Past Surgical History:  Procedure Laterality Date  . LIGAMENT REPAIR Right 12/15/2015   Procedure: RIGHT THUMB RADIAL COLLATERAL LIGAMENT REPAIR;  Surgeon: Betha LoaKevin Kuzma, MD;  Location: [facility];  Service: Orthopedics;  Laterality: Right;   Family History: History reviewed. No pertinent family  history.  Family Psychiatric  History: See H&P  Social History:  History  Alcohol Use No     History  Drug Use  . Types: Marijuana    Social History   Social History  . Marital status: Single    Spouse name: N/A  . Number of children: N/A  . Years of education: N/A   Social History Main Topics  . Smoking status: Never Smoker  . Smokeless tobacco: Never Used  . Alcohol use No  . Drug use: Yes    Types: Marijuana  . Sexual activity: Not Asked   Other Topics Concern  . None   Social History Narrative  . None   Additional Social History:   Sleep: Fair  Appetite:  Fair  Current Medications: Current Facility-Administered Medications  Medication Dose Route Frequency Provider Last Rate Last Dose  . acetaminophen (TYLENOL) tablet 650 mg  650 mg Oral Q6H PRN Okonkwo, Justina A, NP      . alum & mag hydroxide-simeth (MAALOX/MYLANTA) 200-200-20 MG/5ML suspension 30 mL  30 mL Oral Q4H PRN Okonkwo, Justina A, NP      . carbamazepine (TEGRETOL) tablet 200 mg  200 mg Oral BID Okonkwo, Justina A, NP   200 mg at 07/18/17 0829  . hydrOXYzine (ATARAX/VISTARIL) tablet 25 mg  25 mg Oral TID PRN Ferne Reuskonkwo, Justina A, NP   25 mg at 07/18/17 0829  . OLANZapine zydis (ZYPREXA) disintegrating tablet 5 mg  5 mg Oral Q8H PRN Kerry HoughSimon, Spencer E, PA-C   5 mg at 07/18/17 [id number]   And  . LORazepam (ATIVAN) tablet 1 mg  1 mg Oral PRN Kerry HoughSimon, Spencer E, PA-C  And  . ziprasidone (GEODON) injection 20 mg  20 mg Intramuscular PRN Donell Sievert E, PA-C      . magnesium hydroxide (MILK OF MAGNESIA) suspension 30 mL  30 mL Oral Daily PRN Okonkwo, Justina A, NP      . QUEtiapine (SEROQUEL) tablet 100 mg  100 mg Oral QHS Carter Kassel I, NP      . traZODone (DESYREL) tablet 50 mg  50 mg Oral QHS PRN Beryle Lathe, Justina A, NP   50 mg at 07/16/17 2252   Lab Results:  Results for orders placed or performed during the hospital encounter of 07/16/17 (from the past 48 hour(s))  TSH     Status: None   Collection  Time: 07/17/17  6:43 AM  Result Value Ref Range   TSH 1.333 0.350 - 4.500 uIU/mL    Comment: Performed by a 3rd Generation assay with a functional sensitivity of <=0.01 uIU/mL. Performed at [facility], [address], [place], [place] [address]   Hemoglobin A1c     Status: None   Collection Time: 07/17/17  6:43 AM  Result Value Ref Range   Hgb A1c MFr Bld 5.2 4.8 - 5.6 %    Comment: (NOTE)         Pre-diabetes: 5.7 - 6.4         Diabetes: >6.4         Glycemic control for adults with diabetes: <7.0    Mean Plasma Glucose 103 mg/dL    Comment: (NOTE) Performed At: [facility] [address] [place], [place] [id number] Mila Homer MD [id number] Performed at [facility], [address], [place], [place] [address]   Prolactin     Status: Abnormal   Collection Time: 07/17/17  6:43 AM  Result Value Ref Range   Prolactin 36.8 (H) 4.0 - 15.2 ng/mL    Comment: (NOTE) Performed At: [facility] [address] [place], [place] [id number] Mila Homer MD [id number] Performed at [facility], [address], [place], [place] [address]   Lipid panel     Status: None   Collection Time: 07/17/17  6:43 AM  Result Value Ref Range   Cholesterol 164 0 - 200 mg/dL   Triglycerides 644 <034 mg/dL   HDL 72 >74 mg/dL   Total CHOL/HDL Ratio 2.3 RATIO   VLDL 23 0 - 40 mg/dL   LDL Cholesterol 69 0 - 99 mg/dL    Comment:        Total Cholesterol/HDL:CHD Risk Coronary Heart Disease Risk Table                     Men   Women  1/2 Average Risk   3.4   3.3  Average Risk       5.0   4.4  2 X Average Risk   9.6   7.1  3 X Average Risk  23.4   11.0        Use the calculated Patient Ratio above and the CHD Risk Table to determine the patient's CHD Risk.        ATP III CLASSIFICATION (LDL):  <100     mg/dL   Optimal  259-563  mg/dL   Near or Above                    Optimal  130-159  mg/dL   Borderline   875-643  mg/dL   High  >329  mg/dL   Very High Performed at [facility], [address] [place], [place] [address]   Lipid panel     Status: None   Collection Time: 07/18/17  6:59 AM  Result Value Ref Range   Cholesterol 161 0 - 200 mg/dL   Triglycerides 578127 <469<150 mg/dL   HDL 76 >62>40 mg/dL   Total CHOL/HDL Ratio 2.1 RATIO   VLDL 25 0 - 40 mg/dL   LDL Cholesterol 60 0 - 99 mg/dL    Comment:        Total Cholesterol/HDL:CHD Risk Coronary Heart Disease Risk Table                     Men   Women  1/2 Average Risk   3.4   3.3  Average Risk       5.0   4.4  2 X Average Risk   9.6   7.1  3 X Average Risk  23.4   11.0        Use the calculated Patient Ratio above and the CHD Risk Table to determine the patient's CHD Risk.        ATP III CLASSIFICATION (LDL):  <100     mg/dL   Optimal  952-841100-129  mg/dL   Near or Above                    Optimal  130-159  mg/dL   Borderline  324-401160-189  mg/dL   High  >027>190     mg/dL   Very High Performed at [facility], [address] [place], [place] [address]     Blood Alcohol level:  Lab Results  Component Value Date   ETH 108 (H) 07/12/2017   Metabolic Disorder Labs: Lab Results  Component Value Date   HGBA1C 5.2 07/17/2017   MPG 103 07/17/2017   Lab Results  Component Value Date   PROLACTIN 36.8 (H) 07/17/2017   Lab Results  Component Value Date   CHOL 161 07/18/2017   TRIG 127 07/18/2017   HDL 76 07/18/2017   CHOLHDL 2.1 07/18/2017   VLDL 25 07/18/2017   LDLCALC 60 07/18/2017   LDLCALC 69 07/17/2017   Physical Findings: AIMS: Facial and Oral Movements Muscles of Facial Expression: None, normal Lips and Perioral Area: None, normal Jaw: None, normal Tongue: None, normal,Extremity Movements Upper (arms, wrists, hands, fingers): None, normal Lower (legs, knees, ankles, toes): None, normal, Trunk Movements Neck, shoulders, hips: None, normal, Overall Severity Severity of abnormal movements (highest score from  questions above): None, normal Incapacitation due to abnormal movements: None, normal Patient's awareness of abnormal movements (rate only patient's report): No Awareness, Dental Status Current problems with teeth and/or dentures?: No Does patient usually wear dentures?: No  CIWA:    COWS:     Musculoskeletal: Strength & Muscle Tone: within normal limits Gait & Station: normal Patient leans: N/A  Psychiatric Specialty Exam: Physical Exam: Nurses notes & vital signs reviewed.  Review of Systems  Psychiatric/Behavioral: Positive for hallucinations (Auditory hallucinations) and substance abuse (Hx. alcohol use.). Negative for depression, memory loss and suicidal ideas. The patient has insomnia. The patient is not nervous/anxious.     Blood pressure 121/76, pulse 75, temperature 98 F (36.7 C), temperature source Oral, resp. rate 18, height 5\' 9"  (1.753 m), weight 67.1 kg (148 lb).Body mass index is 21.86 kg/m.  General Appearance: Disheveled  Eye Contact: Fair  Speech: Soft, slow  Volume: Decreased  Mood: Depressed,  Affect: Restricted.  Thought Process:  Linear and Descriptions of Associations: Loose  Orientation:  Full (Time, Place, and Person)  Thought Content:  Hallucinations: Auditory, appears to be responding to some internal stimuli.  Suicidal Thoughts: Denies any thoughts, plans or intent.  Homicidal Thoughts: Denies any thoughts, plans or intent.  Memory:  Immediate;   Fair Recent;   Fair Remote;   Fair  Judgement: Impaired  Insight: Shallow  Psychomotor Activity: Decreased  Concentration:  Concentration: Fair and Attention Span: Fair  Recall:  [employer] of Knowledge:  Fair  Language:  Fair  Akathisia: Negative  Handed:  Right  AIMS (if indicated):     Assets:  Desire for Improvement Housing Physical Health Social Support  ADL's: Mildly impaired  Cognition:  Impaired,  Mild  Sleep:  Number of Hours: 5.75     Treatment Plan Summary: Patient is currently  not at his baseline. There remains evidence of psychosis. No evidence of mania. Denies any dangerousness to self or others. We are continuing his treatment for mood stability.   Psychiatric: Schizophrenia, undifferentiated-type.  Medical: Will continue monitor for any symptoms & treat on a prn basis.  Psychosocial:  Strong social support system. Will encourage group counseling attendance & participation.  PLAN: 1.07-18-17: No changes made on the current plan of care, continue current regimen as recommended.  Mood Stabilization:  Continue Tegretol 200 mg bid.  Mood control: Continue Seroquel 100 mg Q hs.  Anxiety/agitation: Continue Hydroxyzine 25 mg prn Q 6 hours. Continue the agitation protocols as recommended.  Insomnia: Continue Trazodone 50 mg Q bedtime.  2. Continue to monitor mood, behavior and interaction with peers. Social to continue to work on the discharge disposition.  Sanjuana Kava, NP, PMHNP, FNP-BC. 07/18/2017, 1:08 PM

## 2017-07-18 NOTE — Progress Notes (Signed)
  DATA ACTION RESPONSE  Objective- Pt. is visible in the dayroom, seen interacting with staff. Presents with an animated    affect and mood. C/o of anxiety. No abnormal s/s.  Subjective- Denies having any SI/HI/VH/Pain at this time.+AH but would not specify. Pt states "The voices makes me calm; Can I also get my anger medication?". Is cooperative and remain safe on the unit.  1:1 interaction in private to establish rapport. Encouragement, education, & support given from staff.  PRN Vistaril and Zyprexa requested and will re-eval accordingly.   Safety maintained with Q 15 checks. Continue with POC.

## 2017-07-19 DIAGNOSIS — F419 Anxiety disorder, unspecified: Secondary | ICD-10-CM

## 2017-07-19 DIAGNOSIS — F39 Unspecified mood [affective] disorder: Secondary | ICD-10-CM

## 2017-07-19 DIAGNOSIS — F1290 Cannabis use, unspecified, uncomplicated: Secondary | ICD-10-CM

## 2017-07-19 DIAGNOSIS — G4700 Insomnia, unspecified: Secondary | ICD-10-CM

## 2017-07-19 LAB — HEMOGLOBIN A1C
Hgb A1c MFr Bld: 5.5 % (ref 4.8–5.6)
Mean Plasma Glucose: 111 mg/dL

## 2017-07-19 LAB — PROLACTIN: Prolactin: 51.8 ng/mL — ABNORMAL HIGH (ref 4.0–15.2)

## 2017-07-19 MED ORDER — QUETIAPINE FUMARATE 200 MG PO TABS
200.0000 mg | ORAL_TABLET | Freq: Every day | ORAL | Status: DC
  Administered 2017-07-19 – 2017-07-22 (×4): 200 mg via ORAL
  Filled 2017-07-19 (×7): qty 1

## 2017-07-19 NOTE — Progress Notes (Signed)
DAR NOTE: Pt present with bright affect and pleasant mood in the unit. Pt has been in the milieu interacting with peers. Pt has been compliant with unit rules, denies physical pain, took all his meds as scheduled. As per self inventory, pt had a good night sleep, good appetite, normal energy, and good concentration. Pt rate depression at 0, hopeless ness at 0, and anxiety at a 0. Pt's safety ensured with 15 minute and environmental checks. Pt currently denies SI/HI and A/V hallucinations. Pt verbally agrees to seek staff if SI/HI or A/VH occurs and to consult with staff before acting on these thoughts. Will continue POC.

## 2017-07-19 NOTE — Progress Notes (Signed)
[facility]   Date/time: 07/19/17  Group Description: Discharge planning group reviews patient's anticipated discharge plans and assists patients to anticipate and address any barriers to wellness/recovery in the community. Suicide prevention education is reviewed with patients in group.   Participation Quality: Active  Plan for Discharge/Comments: Patient looks forward to going to work once discharge. Patient reports there is nothing he is worried about regarding discharge. Patient plans to go back home with his mother.    Transportation Means: Patient reports mother will transport patient back home  Supports: Patient reports having good family support   Fernande BoydenJoyce Nation Cradle, [facility] Clinical Social Worker [facility] Ph: [phone]

## 2017-07-19 NOTE — Progress Notes (Signed)
Recreation Therapy Notes  Date: 07/19/2017 Time: 10:00am Location: [address] Dayroom  Group Topic: Team-Building  Goal Area(s) Addresses:  Pts will be able to work with team members to reach a shared goal. Pts will be able to work on their ability to push through something that is challenging.  Behavioral Response: Engaged  Intervention: Game  Activity: Pts will work together using a rubber band that has 6 strings tied onto it to build a a pyramid using 10 plastic cups. Once pts were finished they played a game of Pictionary.  Education: Team-Building, Discharge Planning  Education Outcome: Acknowledges understanding   Clinical Observations/Feedback: Pt actively participated in both activities. Pt did not contribute during processing discussion, but respectfully listened to peers.  Marvell Fullerachel Meyer, Recreation Therapy Intern  Caroll RancherMarjette Jillianne Gamino, LRT/CTRS

## 2017-07-19 NOTE — Progress Notes (Signed)
Adult Psychoeducational Group Note  Date:  07/19/2017 Time:  8:48 PM  Group Topic/Focus:  Wrap-Up Group:   The focus of this group is to help patients review their daily goal of treatment and discuss progress on daily workbooks.  Participation Level:  Active  Participation Quality:  Appropriate  Affect:  Appropriate  Cognitive:  Appropriate  Insight: Appropriate  Engagement in Group:  Engaged  Modes of Intervention:  Discussion  Additional Comments:  The patient expressed that he rates today a 10. The patient also said that he attended group.  Octavio Mannshigpen, Tanyia Grabbe Lee 07/19/2017, 8:48 PM

## 2017-07-19 NOTE — [facility] Group Notes (Signed)
[facility] LCSW Group Therapy  07/01/2015 1:15 - 2 PM  Type of Therapy:  Group Therapy  Participation Level:  Limited  Participation Quality:  Apprpriate  Affect:  Flat  Cognitive:  Limited  Insight:  Developing/Improving  Engagement in Therapy:  Minimal  Modes of Intervention:  Discussion, Exploration, Socialization and Support  Summary of Progress/Problems:  Chaplain led group explored concept of hope and its relevance to mental health recovery.  Patients explored themes including what matters to them personally, how others responses are similar/different, and what they are hopeful for.  Group members discussed relevance of social supports, innter strength and using their own stories to craft a recovery path.  Identified God as hopeful for him, the origin of hope.  "I learned about that from my uncle."  No other insight offered.  Was able to stay in group most of the time, left room but soon returned.    Bobby [name], Bobby Renninger C LCSW

## 2017-07-19 NOTE — Progress Notes (Signed)
[facility]  07/19/2017 10:36 AM Bobby [name]  MRN:  [id number] Subjective: Bobby [name] reports " I am feeling good today."  Objective: Bobby [name] is awake and alert seen pacing the hall and sitting in the dayroom.  Bobby [name] denies suicidal or homicidal ideation during this assessment. Reports auditory hallucination with some improvement with medications.  He reports feeling less anxious and states he is learning to control his temper. Denies visual hallucination. Patient doses appear to be preoccupied with thoughts.   Patient reports he feels ready to return home to his mother and sister.  Patient reports he is medication compliant without mediation side effects. Bobby [name] denies depression or depressive symptoms.  Reports good appetite and reports he is  resting well. Support, encouragement and reassurance was provided.   Principal Problem: Undifferentiated schizophrenia (HCC) Diagnosis:   Patient Active Problem List   Diagnosis Date Noted  . Undifferentiated schizophrenia (HCC) [F20.3] 07/18/2017  . MDD (major depressive disorder), recurrent, severe, with psychosis (HCC) [F33.3] 07/16/2017   Total Time spent with patient: 30 minutes  Past Psychiatric History:   Past Medical History:  Past Medical History:  Diagnosis Date  . Asthma    prn inhaler  . Rupture of radial collateral ligament of right thumb 11/2015    Past Surgical History:  Procedure Laterality Date  . LIGAMENT REPAIR Right 12/15/2015   Procedure: RIGHT THUMB RADIAL COLLATERAL LIGAMENT REPAIR;  Surgeon: Betha LoaKevin Kuzma, MD;  Location: [facility];  Service: Orthopedics;  Laterality: Right;   Family History: History reviewed. No pertinent family history. Family Psychiatric  History:  Social History:  History  Alcohol Use No     History  Drug Use  . Types: Marijuana    Social History   Social History  . Marital status: Single    Spouse name: N/A  . Number of children: N/A  . Years of education:  N/A   Social History Main Topics  . Smoking status: Never Smoker  . Smokeless tobacco: Never Used  . Alcohol use No  . Drug use: Yes    Types: Marijuana  . Sexual activity: Not Asked   Other Topics Concern  . None   Social History Narrative  . None   Additional Social History:                         Sleep: Fair  Appetite:  Fair  Current Medications: Current Facility-Administered Medications  Medication Dose Route Frequency Provider Last Rate Last Dose  . acetaminophen (TYLENOL) tablet 650 mg  650 mg Oral Q6H PRN Okonkwo, Justina A, NP      . alum & mag hydroxide-simeth (MAALOX/MYLANTA) 200-200-20 MG/5ML suspension 30 mL  30 mL Oral Q4H PRN Okonkwo, Justina A, NP      . carbamazepine (TEGRETOL) tablet 200 mg  200 mg Oral BID Okonkwo, Justina A, NP   200 mg at 07/19/17 0833  . hydrOXYzine (ATARAX/VISTARIL) tablet 25 mg  25 mg Oral TID PRN Ferne Reuskonkwo, Justina A, NP   25 mg at 07/18/17 0829  . OLANZapine zydis (ZYPREXA) disintegrating tablet 5 mg  5 mg Oral Q8H PRN Kerry HoughSimon, Spencer E, PA-C   5 mg at 07/18/17 [id number]   And  . LORazepam (ATIVAN) tablet 1 mg  1 mg Oral PRN Donell SievertSimon, Spencer E, PA-C       And  . ziprasidone (GEODON) injection 20 mg  20 mg Intramuscular PRN Kerry HoughSimon, Spencer E, PA-C      .  magnesium hydroxide (MILK OF MAGNESIA) suspension 30 mL  30 mL Oral Daily PRN Okonkwo, Justina A, NP      . QUEtiapine (SEROQUEL) tablet 200 mg  200 mg Oral QHS Nwoko, Agnes I, NP      . traZODone (DESYREL) tablet 50 mg  50 mg Oral QHS PRN Okonkwo, Justina A, NP   50 mg at 07/16/17 2252    Lab Results:  Results for orders placed or performed during the hospital encounter of 07/16/17 (from the past 48 hour(s))  Prolactin     Status: Abnormal   Collection Time: 07/18/17  6:59 AM  Result Value Ref Range   Prolactin 51.8 (H) 4.0 - 15.2 ng/mL    Comment: (NOTE) Performed At: [facility] [address] [place], [place] [id number] Mila Homer MD WU:[id number] Performed  at [facility], [address], [place], [place] [address]   Hemoglobin A1c     Status: None   Collection Time: 07/18/17  6:59 AM  Result Value Ref Range   Hgb A1c MFr Bld 5.5 4.8 - 5.6 %    Comment: (NOTE)         Pre-diabetes: 5.7 - 6.4         Diabetes: >6.4         Glycemic control for adults with diabetes: <7.0    Mean Plasma Glucose 111 mg/dL    Comment: (NOTE) Performed At: [facility] [address] [place], [place] [id number] Mila Homer MD [id number] Performed at [facility], [address], [place], [place] [address]   Lipid panel     Status: None   Collection Time: 07/18/17  6:59 AM  Result Value Ref Range   Cholesterol 161 0 - 200 mg/dL   Triglycerides 725 <366 mg/dL   HDL 76 >44 mg/dL   Total CHOL/HDL Ratio 2.1 RATIO   VLDL 25 0 - 40 mg/dL   LDL Cholesterol 60 0 - 99 mg/dL    Comment:        Total Cholesterol/HDL:CHD Risk Coronary Heart Disease Risk Table                     Men   Women  1/2 Average Risk   3.4   3.3  Average Risk       5.0   4.4  2 X Average Risk   9.6   7.1  3 X Average Risk  23.4   11.0        Use the calculated Patient Ratio above and the CHD Risk Table to determine the patient's CHD Risk.        ATP III CLASSIFICATION (LDL):  <100     mg/dL   Optimal  034-742  mg/dL   Near or Above                    Optimal  130-159  mg/dL   Borderline  595-638  mg/dL   High  >756     mg/dL   Very High Performed at [facility], [address] [place], [place] [address]     Blood Alcohol level:  Lab Results  Component Value Date   ETH 108 (H) 07/12/2017    Metabolic Disorder Labs: Lab Results  Component Value Date   HGBA1C 5.5 07/18/2017   MPG 111 07/18/2017   MPG 103 07/17/2017   Lab Results  Component Value Date   PROLACTIN 51.8 (H) 07/18/2017  PROLACTIN 36.8 (H) 07/17/2017   Lab Results  Component Value Date   CHOL 161 07/18/2017   TRIG 127 07/18/2017   HDL  76 07/18/2017   CHOLHDL 2.1 07/18/2017   VLDL 25 07/18/2017   LDLCALC 60 07/18/2017   LDLCALC 69 07/17/2017    Physical Findings: AIMS: Facial and Oral Movements Muscles of Facial Expression: None, normal Lips and Perioral Area: None, normal Jaw: None, normal Tongue: None, normal,Extremity Movements Upper (arms, wrists, hands, fingers): None, normal Lower (legs, knees, ankles, toes): None, normal, Trunk Movements Neck, shoulders, hips: None, normal, Overall Severity Severity of abnormal movements (highest score from questions above): None, normal Incapacitation due to abnormal movements: None, normal Patient's awareness of abnormal movements (rate only patient's report): No Awareness, Dental Status Current problems with teeth and/or dentures?: No Does patient usually wear dentures?: No  CIWA:    COWS:     Musculoskeletal: Strength & Muscle Tone: within normal limits Gait & Station: normal Patient leans: N/A  Psychiatric Specialty Exam: Physical Exam  Nursing note and vitals reviewed. Constitutional: He appears well-developed.  Cardiovascular: Normal rate.   Neurological: He is alert.  Psychiatric: He has a normal mood and affect. His behavior is normal.    ROS  Blood pressure 129/74, pulse 86, temperature 98.3 F (36.8 C), temperature source Oral, resp. rate 18, height 5\' 9"  (1.753 m), weight 67.1 kg (148 lb).Body mass index is 21.86 kg/m.  General Appearance: Casual  Eye Contact:  Fair  Speech:  Clear and Coherent  Volume:  Decreased  Mood:  Anxious and Dysphoric  Affect:  Flat and Restricted  Thought Process:  Descriptions of Associations: Loose  Orientation:  Full (Time, Place, and Person)  Thought Content:  Hallucinations: Auditory, Paranoid Ideation and Rumination  Suicidal Thoughts:  No  Homicidal Thoughts:  No  Memory:  Immediate;   Fair Remote;   Fair  Judgement:  Fair  Insight:  Fair  Psychomotor Activity:  Normal  Concentration:  Concentration: Fair   Recall:  [employer]Fair  Fund of Knowledge:  Fair  Language:  Good  Akathisia:  No  Handed:  Right  AIMS (if indicated):     Assets:  Communication Skills Desire for Improvement Resilience Social Support  ADL's:  Intact  Cognition:  WNL  Sleep:  Number of Hours: 6.75    I agree with current treatment plan on 07/19/2017, Patient seen face-to-face for psychiatric evaluation follow-up, chart reviewed and case discussed with the MD Lucianne MussKumar, Advanced Practice Providers and Treatment team. Reviewed the information documented and agree with the treatment plan.  Treatment Plan Summary: Daily contact with patient to assess and evaluate symptoms and progress in treatment and Medication management   Continue with current treatment plan on 07-19-17 except where noted  Mood Stabilization:  Continue Tegretol 200 mg PO BID. Increased  Seroquel 100 mg to 200 mg PO QHS.  Anxiety/agitation: Continue Hydroxyzine 25 mg prn [name] 6 hours. Continue the agitation protocols as recommended.  Insomnia: Continue Trazodone 50 mg [name] bedtime.  Continue to monitor mood, behavior and interaction with peers. Social to continue to work on the discharge disposition.   Oneta Rackanika N Riyan Gavina, NP 07/19/2017, 10:36 AM

## 2017-07-19 NOTE — Progress Notes (Signed)
Nursing Progress Note: 7p-7a D: Pt currently presents with a pleasant/appropriate affect and behavior. Pt states "I had a really good day today." Interacting appropriately with milieu. Pt reports good sleep during the previous night with current medication regimen.   A: Pt provided with medications per providers orders. Pt's labs and vitals were monitored throughout the night. Pt supported emotionally and encouraged to express concerns and questions. Pt educated on medications.  R: Pt's safety ensured with 15 minute and environmental checks. Pt currently denies SI, HI, and AVH. Pt verbally contracts to seek staff if SI,HI, or AVH occurs and to consult with staff before acting on any harmful thoughts. Will continue to monitor.

## 2017-07-19 NOTE — Tx Team (Signed)
Interdisciplinary Treatment and Diagnostic Plan Update  07/19/2017 Time of Session: 8:30am Bobby [name] MRN: [id number]  Principal Diagnosis: major depressive disorder, recurrent, severe with psychosis  Secondary Diagnoses: Principal Problem:   Undifferentiated schizophrenia (HCC) Active Problems:   MDD (major depressive disorder), recurrent, severe, with psychosis (HCC)   Current Medications:  Current Facility-Administered Medications  Medication Dose Route Frequency Provider Last Rate Last Dose  . acetaminophen (TYLENOL) tablet 650 mg  650 mg Oral Q6H PRN Okonkwo, Justina A, NP      . alum & mag hydroxide-simeth (MAALOX/MYLANTA) 200-200-20 MG/5ML suspension 30 mL  30 mL Oral Q4H PRN Okonkwo, Justina A, NP      . carbamazepine (TEGRETOL) tablet 200 mg  200 mg Oral BID Okonkwo, Justina A, NP   200 mg at 07/19/17 0833  . hydrOXYzine (ATARAX/VISTARIL) tablet 25 mg  25 mg Oral TID PRN Ferne Reus A, NP   25 mg at 07/18/17 0829  . OLANZapine zydis (ZYPREXA) disintegrating tablet 5 mg  5 mg Oral Q8H PRN Kerry Hough, PA-C   5 mg at 07/18/17 9147   And  . LORazepam (ATIVAN) tablet 1 mg  1 mg Oral PRN Donell Sievert E, PA-C       And  . ziprasidone (GEODON) injection 20 mg  20 mg Intramuscular PRN Donell Sievert E, PA-C      . magnesium hydroxide (MILK OF MAGNESIA) suspension 30 mL  30 mL Oral Daily PRN Okonkwo, Justina A, NP      . QUEtiapine (SEROQUEL) tablet 200 mg  200 mg Oral QHS Nwoko, Agnes I, NP      . traZODone (DESYREL) tablet 50 mg  50 mg Oral QHS PRN Okonkwo, Justina A, NP   50 mg at 07/16/17 2252    PTA Medications: Prescriptions Prior to Admission  Medication Sig Dispense Refill Last Dose  . albuterol (PROVENTIL HFA;VENTOLIN HFA) 108 (90 BASE) MCG/ACT inhaler Inhale 1-2 puffs into the lungs every 6 (six) hours as needed for wheezing or shortness of breath.    PRN  . HYDROcodone-acetaminophen (NORCO) 5-325 MG tablet 1-2 tabs po q6 hours prn pain (Patient not taking:  Reported on 07/13/2017) 30 tablet 0 Completed Course at Unknown time    Treatment Modalities: Medication Management, Group therapy, Case management,  1 to 1 session with clinician, Psychoeducation, Recreational therapy.  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 Physical Health Supportive family/friends  Physician Treatment Plan for Primary Diagnosis: major depressive disorder, recurrent, severe with psychosis Long Term Goal(s): Improvement in symptoms so as ready for discharge  Short Term Goals: Ability to identify changes in lifestyle to reduce recurrence of condition will improve Ability to verbalize feelings will improve Ability to demonstrate self-control will improve Ability to identify and develop effective coping behaviors will improve Compliance with prescribed medications will improve Ability to identify triggers associated with substance abuse/mental health issues will improve  Medication Management: Evaluate patient's response, side effects, and tolerance of medication regimen.  Therapeutic Interventions: 1 to 1 sessions, Unit Group sessions and Medication administration.  Evaluation of Outcomes: Progressing  Physician Treatment Plan for Secondary Diagnosis: Principal Problem:   Undifferentiated schizophrenia (HCC) Active Problems:   MDD (major depressive disorder), recurrent, severe, with psychosis (HCC)   Long Term Goal(s): Improvement in symptoms so as ready for discharge  Short Term Goals: Ability to identify changes in lifestyle to reduce recurrence of condition will improve Ability to verbalize feelings will improve  Ability to demonstrate self-control will improve Ability to identify and develop effective coping behaviors will improve Compliance with prescribed medications will improve Ability to identify triggers associated with substance abuse/mental  health issues will improve  Medication Management: Evaluate patient's response, side effects, and tolerance of medication regimen.  Therapeutic Interventions: 1 to 1 sessions, Unit Group sessions and Medication administration.  Evaluation of Outcomes: Progressing   RN Treatment Plan for Primary Diagnosis: major depressive disorder, recurrent, severe with psychosis Long Term Goal(s): Knowledge of disease and therapeutic regimen to maintain health will improve  Short Term Goals: Ability to demonstrate self-control, Ability to verbalize feelings will improve and Ability to identify and develop effective coping behaviors will improve  Medication Management: RN will administer medications as ordered by provider, will assess and evaluate patient's response and provide education to patient for prescribed medication. RN will report any adverse and/or side effects to prescribing provider.  Therapeutic Interventions: 1 on 1 counseling sessions, Psychoeducation, Medication administration, Evaluate responses to treatment, Monitor vital signs and CBGs as ordered, Perform/monitor CIWA, COWS, AIMS and Fall Risk screenings as ordered, Perform wound care treatments as ordered.  Evaluation of Outcomes: Progressing   Recreational Therapy Treatment Plan for Primary Diagnosis: Undifferentiated schizophrenia (HCC) Long Term Goal(s): Patient will participate in recreation therapy treatment in at least 2 group sessions without prompting from LRT  Short Term Goals: Anger Management-Patient will be able to successfully recognize at least 2 triggers for anger and coping skills to address identified triggers  Treatment Modalities: Group and Pet Therapy  Therapeutic Interventions: Psychoeducation  Evaluation of Outcomes: Progressing   LCSW Treatment Plan for Primary Diagnosis: major depressive disorder, recurrent, severe with psychosis Long Term Goal(s): Safe transition to appropriate next level of care at  discharge, Engage patient in therapeutic group addressing interpersonal concerns.  Short Term Goals: Engage patient in aftercare planning with referrals and resources, Identify triggers associated with mental health/substance abuse issues and Increase skills for wellness and recovery  Therapeutic Interventions: Assess for all discharge needs, 1 to 1 time with Social worker, Explore available resources and support systems, Assess for adequacy in community support network, Educate family and significant other(s) on suicide prevention, Complete Psychosocial Assessment, Interpersonal group therapy.  Evaluation of Outcomes: Progressing   Progress in Treatment: Attending groups: Yes Participating in groups:Yes  Taking medication as prescribed: Yes, MD continues to assess for medication changes as needed Toleration medication: Yes, no side effects reported at this time Family/Significant other contact made: Yes with mother Patient understands diagnosis: Limited Discussing patient identified problems/goals with staff: Yes Medical problems stabilized or resolved: Yes Denies suicidal/homicidal ideation: Yes Issues/concerns per patient self-inventory: None Other: N/A  New problem(s) identified: None identified at this time.   New Short Term/Long Term Goal(s): None identified at this time.   Discharge Plan or Barriers: Pt will return home and follow-up with outpatient services; referral made to the [facility] 7/27:  Aftercare arranged, patient progressing and able to attend groups, minimal participation at present  Reason for Continuation of Hospitalization: Anxiety Hallucinations Medication stabilization  Estimated Length of Stay: 5 - 7 days; Est DC date 7/29  Attendees: Patient: 07/19/2017  4:53 PM  Physician: Dr. Lucianne MussKumar; F Cobos MD 07/19/2017  4:53 PM  Nursing: Fletcher AnonElizabeth RN, Jane; RN 07/19/2017  4:53 PM  RN Care Manager: Onnie BoerJennifer Clark, RN 07/19/2017  4:53 PM  Social Worker: Fernande BoydenJoyce  Smyre, LCSWA 07/19/2017  4:53 PM  Recreational Therapist:  07/19/2017  4:53 PM  Other: West CarboLashonda, NP; Reola Calkinsravis Money, NP  07/19/2017  4:53 PM  Other:  07/19/2017  4:53 PM  Other: 07/19/2017  4:53 PM    Scribe for Treatment Team: Almeta MonasAnne Cuninngham, LCSW 07/19/2017 4:53 PM

## 2017-07-20 DIAGNOSIS — F203 Undifferentiated schizophrenia: Secondary | ICD-10-CM

## 2017-07-20 NOTE — Progress Notes (Signed)
[facility]  07/20/2017 9:40 AM Bobby [name]  MRN:  [id number] Subjective:  I'm sleeping better.  Objective; patient seen chart reviewed.  Patient is taking his medication and there has been no disruptive behavior.  He admitted he has anger issues and is trying to control his temper.  He is sleeping better but remains isolated withdrawn in the unit.he had a visit from his father and he believed it didn't go very well.  He like to be returned home when he feels stable.  He has no tremors shakes or any EPS.  He does appear to be preoccupied with his thinking. His medicine increased recently and now he is taking Seroquel 200 mg at bedtime.  He does not talk as much in the unit.  He appears guarded and paranoid but denies any hallucination.   Principal Problem: Undifferentiated schizophrenia (HCC) Diagnosis:   Patient Active Problem List   Diagnosis Date Noted  . Undifferentiated schizophrenia (HCC) [F20.3] 07/18/2017  . MDD (major depressive disorder), recurrent, severe, with psychosis (HCC) [F33.3] 07/16/2017   Total Time spent with patient: 20 minutes  Past Psychiatric History: Reviewed.  Past Medical History:  Past Medical History:  Diagnosis Date  . Asthma    prn inhaler  . Rupture of radial collateral ligament of right thumb 11/2015    Past Surgical History:  Procedure Laterality Date  . LIGAMENT REPAIR Right 12/15/2015   Procedure: RIGHT THUMB RADIAL COLLATERAL LIGAMENT REPAIR;  Surgeon: Betha LoaKevin Kuzma, MD;  [facility]  [facility];  Service: Orthopedics;  Laterality: Right;   Family History: History reviewed. No pertinent family history. Family Psychiatric  History: reviewed. Social History:  History  Alcohol Use No     History  Drug Use  . Types: Marijuana    Social History   Social History  . Marital status: Single    Spouse name: N/A  . Number of children: N/A  . Years of education: N/A   Social History Main Topics  . Smoking status: Never  Smoker  . Smokeless tobacco: Never Used  . Alcohol use No  . Drug use: Yes    Types: Marijuana  . Sexual activity: Not Asked   Other Topics Concern  . None   Social History Narrative  . None   Additional Social History:                         Sleep: Good  Appetite:  Fair  Current Medications: Current Facility-Administered Medications  Medication Dose Route Frequency Provider Last Rate Last Dose  . acetaminophen (TYLENOL) tablet 650 mg  650 mg Oral Q6H PRN Okonkwo, Justina A, NP      . alum & mag hydroxide-simeth (MAALOX/MYLANTA) 200-200-20 MG/5ML suspension 30 mL  30 mL Oral Q4H PRN Okonkwo, Justina A, NP      . carbamazepine (TEGRETOL) tablet 200 mg  200 mg Oral BID Okonkwo, Justina A, NP   200 mg at 07/19/17 1904  . hydrOXYzine (ATARAX/VISTARIL) tablet 25 mg  25 mg Oral TID PRN Beryle Lathekonkwo, Justina A, NP   25 mg at 07/18/17 0829  . OLANZapine zydis (ZYPREXA) disintegrating tablet 5 mg  5 mg Oral Q8H PRN Kerry HoughSimon, Spencer E, PA-C   5 mg at 07/18/17 [id number]   And  . LORazepam (ATIVAN) tablet 1 mg  1 mg Oral PRN Donell SievertSimon, Spencer E, PA-C       And  . ziprasidone (GEODON) injection 20 mg  20 mg Intramuscular PRN  Kerry Hough, PA-C      . magnesium hydroxide (MILK OF MAGNESIA) suspension 30 mL  30 mL Oral Daily PRN Okonkwo, Justina A, NP      . QUEtiapine (SEROQUEL) tablet 200 mg  200 mg Oral QHS Nwoko, Agnes I, NP   200 mg at 07/19/17 2206  . traZODone (DESYREL) tablet 50 mg  50 mg Oral QHS PRN Okonkwo, Justina A, NP   50 mg at 07/16/17 2252    Lab Results: No results found for this or any previous visit (from the past 48 hour(s)).  Blood Alcohol level:  Lab Results  Component Value Date   ETH 108 (H) 07/12/2017    Metabolic Disorder Labs: Lab Results  Component Value Date   HGBA1C 5.5 07/18/2017   MPG 111 07/18/2017   MPG 103 07/17/2017   Lab Results  Component Value Date   PROLACTIN 51.8 (H) 07/18/2017   PROLACTIN 36.8 (H) 07/17/2017   Lab Results   Component Value Date   CHOL 161 07/18/2017   TRIG 127 07/18/2017   HDL 76 07/18/2017   CHOLHDL 2.1 07/18/2017   VLDL 25 07/18/2017   LDLCALC 60 07/18/2017   LDLCALC 69 07/17/2017    Physical Findings: AIMS: Facial and Oral Movements Muscles of Facial Expression: None, normal Lips and Perioral Area: None, normal Jaw: None, normal Tongue: None, normal,Extremity Movements Upper (arms, wrists, hands, fingers): None, normal Lower (legs, knees, ankles, toes): None, normal, Trunk Movements Neck, shoulders, hips: None, normal, Overall Severity Severity of abnormal movements (highest score from questions above): None, normal Incapacitation due to abnormal movements: None, normal Patient's awareness of abnormal movements (rate only patient's report): No Awareness, Dental Status Current problems with teeth and/or dentures?: No Does patient usually wear dentures?: No  CIWA:    COWS:     Musculoskeletal: Strength & Muscle Tone: within normal limits Gait & Station: normal Patient leans: N/A  Psychiatric Specialty Exam: Physical Exam  Review of Systems  Constitutional: Negative.   HENT: Negative.   Respiratory: Negative.   Musculoskeletal: Negative.   Skin: Negative.   Neurological: Negative for tremors.    Blood pressure 129/74, pulse 86, temperature 98.3 F (36.8 C), temperature source Oral, resp. rate 18, height 5\' 9"  (1.753 m), weight 67.1 kg (148 lb).Body mass index is 21.86 kg/m.  General Appearance: Guarded  Eye Contact:  Fair  Speech:  Slow  Volume:  Normal  Mood:  Dysphoric  Affect:  Constricted and Flat  Thought Process:  Goal Directed  Orientation:  Full (Time, Place, and Person)  Thought Content:  Paranoid Ideation  Suicidal Thoughts:  No  Homicidal Thoughts:  No  Memory:  Immediate;   Fair Recent;   Fair Remote;   Fair  Judgement:  Fair  Insight:  Fair  Psychomotor Activity:  Decreased  Concentration:  Concentration: Fair and Attention Span: Fair   Recall:  Good  Fund of Knowledge:  Good  Language:  Good  Akathisia:  No  Handed:  Right  AIMS (if indicated):     Assets:  Desire for Improvement Housing Social Support  ADL's:  Intact  Cognition:  WNL  Sleep:  Number of Hours: 6.75     Treatment Plan Summary: Daily contact with patient to assess and evaluate symptoms and progress in treatment and Medication management  Patient continues to show improvement from the past.  Continue Tegretol 200 mg twice a day, continue Seroquel 200 mg at bedtime.  Continue hydroxyzine 25 mg as needed for anxiety. Continue  trazodone 50 mg at bedtime. Encouraged to participate in group milieu therapy.  Social worker to continue on discharge disposition.   Carolin Quang T., MD 07/20/2017, 9:40 AM

## 2017-07-20 NOTE — [facility] Group Notes (Signed)
[facility] Group Notes:  (Nursing/MHT/Case Management/Adjunct)  Date:  07/20/2017  Time:  1:07 PM  Type of Therapy:  Psychoeducational Skills  Participation Level:  Active  Participation Quality:  Appropriate  Affect:  Appropriate  Cognitive:  Appropriate  Insight:  Appropriate  Engagement in Group:  Engaged  Modes of Intervention:  Discussion  Summary of Progress/Problems: Pt did attend self inventory group.   Ledon Weihe Shanta 07/20/2017, 1:07 PM 

## 2017-07-20 NOTE — Progress Notes (Signed)
Patient ID: Bobby [name], male   DOB: Feb 04, 1996, 21 y.o.   MRN: [id number]    D: Pt has been very flat and depressed today on the unit. Pt reported that he just wanted to be discharged. [facility] has [facility] issues. Pt reported that his depression was a 0, his hopelessness was a 0, and his anxiety was a 0. Pt reported that his goal for today was to work on calming anger. Pt reported being negative SI/HI, no AH/VH noted. A: 15 min checks continued for patient safety. R: Pt safety maintained.

## 2017-07-20 NOTE — [facility] Group Notes (Signed)
Guided Meditation, "Working with Anxiety". Patient attended but left before end of group.

## 2017-07-20 NOTE — Progress Notes (Signed)
Adult Psychoeducational Group Note  Date:  07/20/2017 Time:  8:46 PM  Group Topic/Focus:  Wrap-Up Group:   The focus of this group is to help patients review their daily goal of treatment and discuss progress on daily workbooks.  Participation Level:  Active  Participation Quality:  Appropriate  Affect:  Appropriate  Cognitive:  Appropriate  Insight: Appropriate  Engagement in Group:  Engaged  Modes of Intervention:  Discussion  Additional Comments: The patient expressed that he attend the relaxation group.The patient also said that he rates today a 10.  Octavio Mannshigpen, Genevieve Arbaugh Lee 07/20/2017, 8:46 PM

## 2017-07-20 NOTE — Progress Notes (Signed)
Writer observed patient up in the dayroom watching tv with no interaction with peers. He reports having had a good day and voiced no complaints. He was informed of his scheduled medication and was in agreement to take it when scheduled. He reports that his goal is to be discharged as soon as possible. Support given and safety maintained on unit with 15 min checks.
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
## 2017-07-21 NOTE — [facility] Group Notes (Signed)
Guided meditation group - mental clarity. Patient attended, toward the end began crying and making ritualized motions with his arms. Refused to discuss with RN.

## 2017-07-21 NOTE — Progress Notes (Signed)
D: Patient's self inventory sheet: patient has good sleep, no sleep medication.good  Appetite, normal energy level, good concentration. Rated depression 0/10, hopeless 0/10, anxiety 0/10. SI/HI/AVH: Denies all. Physical complaints are denied. Goal is "calming down". Plans to work on "attend all the meetings".   A: Medications administered, assessed medication knowledge and education given on medication regimen.  Emotional support and encouragement given patient. R: Denies SI and HI , contracts for safety. Safety maintained with 15 minute checks.

## 2017-07-21 NOTE — [facility] Group Notes (Signed)
[facility] Group Notes:  (Clinical Social Work)  07/21/2017  11:00AM-12:00PM  Summary of Progress/Problems:  The main focus of today's process group was to listen to a variety of genres of music and to identify that different types of music provoke different responses.  The patient then was able to identify personally what was soothing for them, as well as energizing, as well as how patient can personally use this knowledge in sleep habits, with depression, and with other symptoms.  The patient expressed at the beginning of group the overall feeling of "agitated", then left and returned many times throughout group.  He smiled a lot, danced, interacted with others.  At the end of group he said he was "tense" and that his agitation had not decreased.  Type of Therapy:  Music Therapy   Participation Level:  Active  Participation Quality:  Attentive and Inattentive  Affect:  Blunted  Cognitive:  Disorganized  Insight:  Poor  Engagement in Therapy:  Poor  Modes of Intervention:   Activity, Exploration  [name], LCSW 07/21/2017

## 2017-07-21 NOTE — Progress Notes (Signed)
Write has observed patient up in the dayroom earlier visiting with his parents. He reports having had a good day and had told his mother that he was discharging on tomorrow.His mother was concerned that he was discharging and wanted to know the time if discharging.  Writer informed him that currently there was not a note in saying that he was discharging on tomorrow and would need to speak with his doctor concerning discharge. He was receptive and his mother was reassured and asked to be notified in advance when he is to discharge. He was compliant with his medication, spent a little time in the dayroom watching tv and went to his room to lie down. He remains a little guarded and suspicious. While taking his medication he did make a statement that he loves his mom very much when writer made comment that he has great support from his parents. Support given and safety maintained on unit.

## 2017-07-21 NOTE — Progress Notes (Signed)
[facility]  07/21/2017 11:59 AM Bobby [name]  MRN:  [id number] Subjective:  I'm taking medication.  I'm sleeping better.  Objective; patient seen chart reviewed.  Patient is compliant with the medication and denies any side effects.  He sleeping better and denies any hallucination or any paranoia.  However he remains sometime isolated, withdrawn and does not participate in groups.  He remains guarded about his symptoms but denies any suicidal thoughts.  He has no tremors or shakes.  He is not disruptive in the unit.   Principal Problem: Undifferentiated schizophrenia (HCC) Diagnosis:   Patient Active Problem List   Diagnosis Date Noted  . Undifferentiated schizophrenia (HCC) [F20.3] 07/18/2017  . MDD (major depressive disorder), recurrent, severe, with psychosis (HCC) [F33.3] 07/16/2017   Total Time spent with patient: 15 minutes  Past Psychiatric History: reviewed.  Past Medical History:  Past Medical History:  Diagnosis Date  . Asthma    prn inhaler  . Rupture of radial collateral ligament of right thumb 11/2015    Past Surgical History:  Procedure Laterality Date  . LIGAMENT REPAIR Right 12/15/2015   Procedure: RIGHT THUMB RADIAL COLLATERAL LIGAMENT REPAIR;  Surgeon: Betha LoaKevin Kuzma, MD;  Location: [facility];  Service: Orthopedics;  Laterality: Right;   Family History: History reviewed. No pertinent family history. Family Psychiatric  History: reviewed. Social History:  History  Alcohol Use No     History  Drug Use  . Types: Marijuana    Social History   Social History  . Marital status: Single    Spouse name: N/A  . Number of children: N/A  . Years of education: N/A   Social History Main Topics  . Smoking status: Never Smoker  . Smokeless tobacco: Never Used  . Alcohol use No  . Drug use: Yes    Types: Marijuana  . Sexual activity: Not Asked   Other Topics Concern  . None   Social History Narrative  . None   Additional Social  History:                         Sleep: Good  Appetite:  Fair  Current Medications: Current Facility-Administered Medications  Medication Dose Route Frequency Provider Last Rate Last Dose  . acetaminophen (TYLENOL) tablet 650 mg  650 mg Oral Q6H PRN Okonkwo, Justina A, NP      . alum & mag hydroxide-simeth (MAALOX/MYLANTA) 200-200-20 MG/5ML suspension 30 mL  30 mL Oral Q4H PRN Okonkwo, Justina A, NP      . carbamazepine (TEGRETOL) tablet 200 mg  200 mg Oral BID Okonkwo, Justina A, NP   200 mg at 07/21/17 0753  . hydrOXYzine (ATARAX/VISTARIL) tablet 25 mg  25 mg Oral TID PRN Beryle Lathekonkwo, Justina A, NP   25 mg at 07/18/17 0829  . OLANZapine zydis (ZYPREXA) disintegrating tablet 5 mg  5 mg Oral Q8H PRN Kerry HoughSimon, Spencer E, PA-C   5 mg at 07/18/17 [id number]   And  . LORazepam (ATIVAN) tablet 1 mg  1 mg Oral PRN Donell SievertSimon, Spencer E, PA-C       And  . ziprasidone (GEODON) injection 20 mg  20 mg Intramuscular PRN Donell SievertSimon, Spencer E, PA-C      . magnesium hydroxide (MILK OF MAGNESIA) suspension 30 mL  30 mL Oral Daily PRN Okonkwo, Justina A, NP      . QUEtiapine (SEROQUEL) tablet 200 mg  200 mg Oral QHS Sanjuana KavaNwoko, Agnes I, NP  200 mg at 07/20/17 2102  . traZODone (DESYREL) tablet 50 mg  50 mg Oral QHS PRN Okonkwo, Justina A, NP   50 mg at 07/16/17 2252    Lab Results: No results found for this or any previous visit (from the past 48 hour(s)).  Blood Alcohol level:  Lab Results  Component Value Date   ETH 108 (H) 07/12/2017    Metabolic Disorder Labs: Lab Results  Component Value Date   HGBA1C 5.5 07/18/2017   MPG 111 07/18/2017   MPG 103 07/17/2017   Lab Results  Component Value Date   PROLACTIN 51.8 (H) 07/18/2017   PROLACTIN 36.8 (H) 07/17/2017   Lab Results  Component Value Date   CHOL 161 07/18/2017   TRIG 127 07/18/2017   HDL 76 07/18/2017   CHOLHDL 2.1 07/18/2017   VLDL 25 07/18/2017   LDLCALC 60 07/18/2017   LDLCALC 69 07/17/2017    Physical Findings: AIMS: Facial and  Oral Movements Muscles of Facial Expression: None, normal Lips and Perioral Area: None, normal Jaw: None, normal Tongue: None, normal,Extremity Movements Upper (arms, wrists, hands, fingers): None, normal Lower (legs, knees, ankles, toes): None, normal, Trunk Movements Neck, shoulders, hips: None, normal, Overall Severity Severity of abnormal movements (highest score from questions above): None, normal Incapacitation due to abnormal movements: None, normal Patient's awareness of abnormal movements (rate only patient's report): No Awareness, Dental Status Current problems with teeth and/or dentures?: No Does patient usually wear dentures?: No  CIWA:    COWS:     Musculoskeletal: Strength & Muscle Tone: within normal limits Gait & Station: normal Patient leans: N/A  Psychiatric Specialty Exam: Physical Exam  Review of Systems  Constitutional: Negative.   HENT: Negative.   Respiratory: Negative.   Genitourinary: Negative.   Musculoskeletal: Negative.   Skin: Negative.   Neurological: Negative.     Blood pressure 138/60, pulse 96, temperature 98.6 F (37 C), temperature source Oral, resp. rate 16, height 5\' 9"  (1.753 m), weight 67.1 kg (148 lb).Body mass index is 21.86 kg/m.  General Appearance: Casual  Eye Contact:  Fair  Speech:  Slow  Volume:  Normal  Mood:  Anxious and Dysphoric  Affect:  Constricted  Thought Process:  Goal Directed  Orientation:  Full (Time, Place, and Person)  Thought Content:  Paranoid Ideation  Suicidal Thoughts:  No  Homicidal Thoughts:  No  Memory:  Immediate;   Fair Recent;   Fair Remote;   Fair  Judgement:  Fair  Insight:  Fair  Psychomotor Activity:  Decreased  Concentration:  Concentration: Fair and Attention Span: Fair  Recall:  Good  Fund of Knowledge:  Good  Language:  Good  Akathisia:  No  Handed:  Right  AIMS (if indicated):     Assets:  Communication Skills Desire for Improvement Housing  ADL's:  Intact  Cognition:  WNL   Sleep:  Number of Hours: 6.75     Treatment Plan Summary: Daily contact with patient to assess and evaluate symptoms and progress in treatment and Medication management  Patient is showing improvement from the past.  Continue Tegretol 200 mg twice a day, Seroquel 200 mg at bedtime.  Continue hydroxyzine 25 mg as needed for anxiety and trazodone 50 mg at bedtime. We will get Tegretol level in the morning. Encouraged to participate in group milieu therapy. Labs reviewed.  His hemoglobin A1c is 5.5 and lipid panel normal.    Paizley Ramella T., MD 07/21/2017, 11:59 AM

## 2017-07-22 LAB — CARBAMAZEPINE LEVEL, TOTAL: Carbamazepine Lvl: 8.4 ug/mL (ref 4.0–12.0)

## 2017-07-22 NOTE — Progress Notes (Signed)
[facility] MD Progress Note  07/22/2017 4:05 PM Bobby [name]  MRN:  [id number] Subjective:  Patient states that he is feeling well today. He reports that he is waiting to go to the gym today. He denies any SI/HI/AVH. He feels he is ready to be discharged.  Objective: Patient is pleasant and cooperative. He does not appear to be having any internal stimuli. His nurse states that he has been doing well today and has been conversational, but limited.   Principal Problem: Undifferentiated schizophrenia (HCC) Diagnosis:   Patient Active Problem List   Diagnosis Date Noted  . Undifferentiated schizophrenia (HCC) [F20.3] 07/18/2017  . MDD (major depressive disorder), recurrent, severe, with psychosis (HCC) [F33.3] 07/16/2017   Total Time spent with patient: 25 minutes  Past Psychiatric History: See H&P  Past Medical History:  Past Medical History:  Diagnosis Date  . Asthma    prn inhaler  . Rupture of radial collateral ligament of right thumb 11/2015    Past Surgical History:  Procedure Laterality Date  . LIGAMENT REPAIR Right 12/15/2015   Procedure: RIGHT THUMB RADIAL COLLATERAL LIGAMENT REPAIR;  Surgeon: Betha Loa, MD;  Location: [facility];  Service: Orthopedics;  Laterality: Right;   Family History: History reviewed. No pertinent family history. Family Psychiatric  History: See H&P Social History:  History  Alcohol Use No     History  Drug Use  . Types: Marijuana    Social History   Social History  . Marital status: Single    Spouse name: N/A  . Number of children: N/A  . Years of education: N/A   Social History Main Topics  . Smoking status: Never Smoker  . Smokeless tobacco: Never Used  . Alcohol use No  . Drug use: Yes    Types: Marijuana  . Sexual activity: Not Asked   Other Topics Concern  . None   Social History Narrative  . None   Additional Social History:                         Sleep: Good  Appetite:  Good  Current  Medications: Current Facility-Administered Medications  Medication Dose Route Frequency Provider Last Rate Last Dose  . acetaminophen (TYLENOL) tablet 650 mg  650 mg Oral Q6H PRN Okonkwo, Justina A, NP      . alum & mag hydroxide-simeth (MAALOX/MYLANTA) 200-200-20 MG/5ML suspension 30 mL  30 mL Oral Q4H PRN Okonkwo, Justina A, NP      . carbamazepine (TEGRETOL) tablet 200 mg  200 mg Oral BID Okonkwo, Justina A, NP   200 mg at 07/22/17 0758  . hydrOXYzine (ATARAX/VISTARIL) tablet 25 mg  25 mg Oral TID PRN Beryle Lathe, Justina A, NP   25 mg at 07/18/17 0829  . OLANZapine zydis (ZYPREXA) disintegrating tablet 5 mg  5 mg Oral Q8H PRN Kerry Hough, PA-C   5 mg at 07/18/17 6213   And  . LORazepam (ATIVAN) tablet 1 mg  1 mg Oral PRN Donell Sievert E, PA-C       And  . ziprasidone (GEODON) injection 20 mg  20 mg Intramuscular PRN Donell Sievert E, PA-C      . magnesium hydroxide (MILK OF MAGNESIA) suspension 30 mL  30 mL Oral Daily PRN Okonkwo, Justina A, NP      . QUEtiapine (SEROQUEL) tablet 200 mg  200 mg Oral QHS Nwoko, Agnes I, NP   200 mg at 07/21/17 2140  . traZODone (DESYREL)  tablet 50 mg  50 mg Oral QHS PRN Beryle Lathekonkwo, Justina A, NP   50 mg at 07/16/17 2252    Lab Results:  Results for orders placed or performed during the hospital encounter of 07/16/17 (from the past 48 hour(s))  Carbamazepine level, total     Status: None   Collection Time: 07/22/17  6:13 AM  Result Value Ref Range   Carbamazepine Lvl 8.4 4.0 - 12.0 ug/mL    Comment: Performed at [facility], [address] [place], [place] [address]    Blood Alcohol level:  Lab Results  Component Value Date   ETH 108 (H) 07/12/2017    Metabolic Disorder Labs: Lab Results  Component Value Date   HGBA1C 5.5 07/18/2017   MPG 111 07/18/2017   MPG 103 07/17/2017   Lab Results  Component Value Date   PROLACTIN 51.8 (H) 07/18/2017   PROLACTIN 36.8 (H) 07/17/2017   Lab Results  Component Value Date   CHOL 161 07/18/2017    TRIG 127 07/18/2017   HDL 76 07/18/2017   CHOLHDL 2.1 07/18/2017   VLDL 25 07/18/2017   LDLCALC 60 07/18/2017   LDLCALC 69 07/17/2017    Physical Findings: AIMS: Facial and Oral Movements Muscles of Facial Expression: None, normal Lips and Perioral Area: None, normal Jaw: None, normal Tongue: None, normal,Extremity Movements Upper (arms, wrists, hands, fingers): None, normal Lower (legs, knees, ankles, toes): None, normal, Trunk Movements Neck, shoulders, hips: None, normal, Overall Severity Severity of abnormal movements (highest score from questions above): None, normal Incapacitation due to abnormal movements: None, normal Patient's awareness of abnormal movements (rate only patient's report): No Awareness, Dental Status Current problems with teeth and/or dentures?: No Does patient usually wear dentures?: No  CIWA:    COWS:     Musculoskeletal: Strength & Muscle Tone: within normal limits Gait & Station: normal Patient leans: N/A  Psychiatric Specialty Exam: Physical Exam  Nursing note and vitals reviewed. Constitutional: He is oriented to person, place, and time. He appears well-developed and well-nourished.  Cardiovascular: Normal rate.   Musculoskeletal: Normal range of motion.  Neurological: He is alert and oriented to person, place, and time.    Review of Systems  Constitutional: Negative.   HENT: Negative.   Eyes: Negative.   Respiratory: Negative.   Cardiovascular: Negative.   Gastrointestinal: Negative.   Genitourinary: Negative.   Musculoskeletal: Negative.   Skin: Negative.   Neurological: Negative.   Endo/Heme/Allergies: Negative.     Blood pressure (!) 131/53, pulse 84, temperature 97.8 F (36.6 C), temperature source Oral, resp. rate 18, height 5\' 9"  (1.753 m), weight 67.1 kg (148 lb).Body mass index is 21.86 kg/m.  General Appearance: Casual  Eye Contact:  Good  Speech:  Clear and Coherent and Normal Rate  Volume:  Normal  Mood:  Euthymic   Affect:  Flat  Thought Process:  Coherent and Descriptions of Associations: Intact  Orientation:  Full (Time, Place, and Person)  Thought Content:  Seems logical but provides limited conversation  Suicidal Thoughts:  No  Homicidal Thoughts:  No  Memory:  Immediate;   Good  Judgement:  Fair  Insight:  Fair  Psychomotor Activity:  Normal  Concentration:  Concentration: Good  Recall:  Fair  Fund of Knowledge:  Fair  Language:  Good  Akathisia:  No  Handed:  Right  AIMS (if indicated):     Assets:  Financial Resources/Insurance Social Support  ADL's:  Intact  Cognition:  WNL  Sleep:  Number of  Hours: 6.5     Treatment Plan Summary: Daily contact with patient to assess and evaluate symptoms and progress in treatment, Medication management and Plan is to:  -Continue Seroquel 200 mg PO QHS for mood stabilization -Continue Trazodone QHS PRN for insomnia -Continue Tegretol 200 mg PO BID for mood stabilization -Continue Vistaril PRN for anxiety -Continue Geodon, Ativan, and Zyprexa PRN for extreme agitation   Maryfrances Bunnellravis B Tyanna Hach, FNP 07/22/2017, 4:05 PM

## 2017-07-22 NOTE — Progress Notes (Signed)
The focus of this group is to help patients review their daily goal of treatment and discuss progress on daily workbooks. Pt attended the evening group session and responded to all discussion prompts from the Writer. Pt shared that today was a good day on the unit, the highlights of which were going to the gym to shoot basketball and a visit from his parents.  Pt rated his day an 8 out of 10 and his affect was appropriate. Pt requested towels and soap, which were provided to him following wrap-up.

## 2017-07-22 NOTE — Progress Notes (Signed)
Recreation Therapy Notes  Date: 07/22/2017 Time: 10:00am Location: [address] Dayroom  Group Topic: Coping Skills  Goal Area(s) Addresses:  Pt will be able to successfully identify at least 6 positive coping skills.  Behavioral Response: Disengaged  Intervention: Art/Worksheet  Activity: Pt will identify coping skills in the following domains: self-soothing,distraction, opposite action, emotional awareness,mindfulness and crisis plan. Pt will share one coping skill from each category.  Education:Coping Skills, Discharge Planning  Education Outcome: Needs additional education  Clinical Observations/Feedback: Pt did not participate in activity. Pt drew pictures, instead of participating in activity. When intern encouraged pt to do the the activity, he decided not to. Pt identified the following coping skills: playing sports and working.  Marvell Fullerachel Meyer, Recreation Therapy Intern  Caroll RancherMarjette Miana Politte, LRT/CTRS

## 2017-07-22 NOTE — [facility] Group Notes (Signed)
[facility] LCSW Group Therapy  07/22/2017 1:15 pm  Type of Therapy: Process Group Therapy  Participation Level:  Active  Participation Quality:  Appropriate  Affect:  Flat  Cognitive:  Oriented  Insight:  Improving  Engagement in Group:  Limited  Engagement in Therapy:  Limited  Modes of Intervention:  Activity, Clarification, Education, Problem-solving and Support  Summary of Progress/Problems: Today's group addressed the issue of overcoming obstacles.  Patients were asked to identify their biggest obstacle post d/c that stands in the way of their on-going success, and then problem solve as to how to manage this. Bobby [name] identified "staying calm" as his biggest obstacle, and stated that "people get hurt" when he loses his cool.  However, Q was unable to identify any problems with this scenario. Either lacks insight or is minimizing, but either way is not a good prognosis going forward.    Daryel Geraldorth, Bobby [name] 07/22/2017   3:25 PM

## 2017-07-22 NOTE — Progress Notes (Signed)
Patient ID: Bobby [name], male   DOB: April 20, 1996, 21 y.o.   MRN: [id number] D   ---   Pt agrees to contract and denies pain.  He maintains a suspicious , watchful affect and has minimal conversation.  He prefers to stay to himself, but is receptive to staff.  Pt takes medications as asked and shows no sign of adverse effects.  Pt has not been seen responding to internal stimuli so far today.  Pt leaves unit to go to [employer] and cafeteria with peers.  Pt shows no aggressive , hostile behaviors   --- A ---  Provide support and safety  --  R ---  Pt remains safe on [address]

## 2017-07-22 NOTE — Progress Notes (Signed)
Patient resting in bed with eyes closed; respirations regular and unlabored. No signs of distress noted at this time.  

## 2017-07-23 MED ORDER — TRAZODONE HCL 50 MG PO TABS: 50.0000 mg | ORAL_TABLET | Freq: Every evening | ORAL | 0 refills | Status: DC | PRN

## 2017-07-23 MED ORDER — CARBAMAZEPINE 200 MG PO TABS: 200.0000 mg | ORAL_TABLET | Freq: Two times a day (BID) | ORAL | 0 refills | Status: DC

## 2017-07-23 MED ORDER — QUETIAPINE FUMARATE 200 MG PO TABS: 200.0000 mg | ORAL_TABLET | Freq: Every day | ORAL | 0 refills | Status: DC

## 2017-07-23 MED ORDER — HYDROXYZINE HCL 25 MG PO TABS: 25.0000 mg | ORAL_TABLET | Freq: Three times a day (TID) | ORAL | 0 refills | Status: DC | PRN
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
## 2017-07-23 NOTE — [facility] Suicide Risk Assessment (Signed)
[facility]   Principal Problem: Undifferentiated schizophrenia [facility](HCC) Discharge Diagnoses:  Patient Active Problem List   Diagnosis Date Noted  . MDD (major depressive disorder), recurrent, severe, with psychosis (HCC) [F33.3] 07/16/2017    Priority: High  . Undifferentiated schizophrenia (HCC) [F20.3] 07/18/2017   Patient is a 21 year old male [facility] and treatment of agitation, aggressive behaviors and auditory and visual hallucinations  Patient reports that he's doing well, adds that he is going to take his medications as prescribed. Patient reports that he is calm, knows he needs medications, adds that his family supportive. He states that he gets agitated when he is off his medications as he starts hearing voices. Discussed with patient in length the need to be compliant with his medications. Patient reports that his mood is good, denies any symptoms of mania, paranoia, hallucinations or any symptoms of depression. He also denies any side effects with the medications Total Time spent with patient: 30 minutes  Musculoskeletal: Strength & Muscle Tone: within normal limits Gait & Station: normal Patient leans: N/A  Psychiatric Specialty Exam: Review of Systems  Constitutional: Negative.  Negative for fever and malaise/fatigue.  HENT: Negative.  Negative for congestion and sore throat.   Eyes: Negative.  Negative for blurred vision, double vision, discharge and redness.  Respiratory: Negative.  Negative for cough, shortness of breath and wheezing.   Cardiovascular: Negative.  Negative for chest pain and palpitations.  Gastrointestinal: Negative.  Negative for abdominal pain, constipation, diarrhea, heartburn, nausea and vomiting.  Musculoskeletal: Negative.  Negative for falls, joint pain and myalgias.  Skin: Negative.  Negative for rash.  Neurological: Negative.  Negative for dizziness,  seizures, loss of consciousness, weakness and headaches.  Endo/Heme/Allergies: Negative.  Negative for environmental allergies.  Psychiatric/Behavioral: Negative.  Negative for depression, hallucinations, substance abuse and suicidal ideas. The patient is not nervous/anxious and does not have insomnia.     Blood pressure (!) 131/53, pulse 84, temperature 97.8 F (36.6 C), temperature source Oral, resp. rate 18, height 5\' 9"  (1.753 m), weight 67.1 kg (148 lb).Body mass index is 21.86 kg/m.  General Appearance: Casual  Eye Contact::  Fair  Speech:  Clear and Coherent and Normal Rate409  Volume:  Normal  Mood:  Euthymic  Affect:  Congruent and Full Range  Thought Process:  Coherent, Goal Directed and Descriptions of Associations: Intact  Orientation:  Full (Time, Place, and Person)  Thought Content:  WDL  Suicidal Thoughts:  No  Homicidal Thoughts:  No  Memory:  Immediate;   Fair Recent;   Fair Remote;   Fair  Judgement:  Fair  Insight:  Present  Psychomotor Activity:  Normal  Concentration:  Fair  Recall:  Fis[employer]Fair  Fund of Knowledge:Fair  Language: Fair  Akathisia:  No  Handed:  Right  AIMS (if indicated):     Assets:  Engineer, maintenanceCommunication Skills Housing Physical Health Social Support  Sleep:  Number of Hours: 6.5  Cognition: WNL  ADL's:  Intact   Mental Status Per Nursing Assessment::   On Admission:  NA  Demographic Factors:  Male, Adolescent or young adult, Low socioeconomic status and Unemployed  Loss Factors: NA  Historical Factors: Impulsivity  Risk Reduction Factors:   Living with another person, especially a relative  Continued Clinical Symptoms:  Previous Psychiatric Diagnoses and Treatments  Cognitive Features That Contribute To Risk:  None    Suicide Risk:  Minimal: No identifiable suicidal ideation.  Patients presenting  with no risk factors but with morbid ruminations; may be classified as minimal risk based on the severity of the depressive  symptoms  Follow-up Information    [facility] Follow up.   Specialty:  Behavioral Health Why:  Patient will be new to this provider for therapy and medication management. Initial assessment is scheduled for July 30, 2017 at 3:00pm.  Contact information: [address] [place] [address] [phone]           Plan Of Care/Follow-up recommendations:  Activity:  As tolerated Diet:  Regular Other:  Keep follow-up appointments and take medications as prescribed  Nelly RoutKUMAR,Gibson Telleria, MD 07/23/2017, 10:24 AM

## 2017-07-23 NOTE — Progress Notes (Signed)
  [facility] :  Will you be returning to the same living situation after discharge:  Yes,  home At discharge, do you have transportation home?: Yes,  family Do you have the ability to pay for your medications: Yes,  insurance  Release of information consent forms completed and in the chart;  Patient's signature needed at discharge.  Patient to Follow up at: Follow-up Information    [facility] Follow up.   Specialty:  Behavioral Health Why:  Patient will be new to this provider for therapy and medication management. Initial assessment is scheduled for July 30, 2017 at 3:00pm.  Contact information: [address] [place] [address] [phone]           Next level of care provider has access to [facility] Link:no  Safety Planning and Suicide Prevention discussed: Yes,  yes  Have you used any form of tobacco in the last 30 days? (Cigarettes, Smokeless Tobacco, Cigars, and/or Pipes): No  Has patient been referred to the Quitline?: N/A patient is not a smoker  Patient has been referred for addiction treatment: Yes  Ida RogueRodney B Anjeanette Petzold, LCSW 07/23/2017, 10:16 AM

## 2017-07-23 NOTE — Progress Notes (Signed)
Recreation Therapy Notes  Date: 07/23/2017 Time: 10:00am Location: [address] Dayroom  Group Topic: Teamwork, Communication, Problem-Solving  Goal Area(s) Addresses:  Pt will be able to successfully identify the benefits of communication when working in teams. Pt will be able to successfully link this activity with how communication impacts their lives outside of the hospital.  Behavioral Response: Minimal  Intervention:  [employer] "Choices" Game  Activity: Pts will work together to reach a common goal by using communication. Pts will be split into smaller groups,A and B and will be asked to work together to trade places so team A switches spots with team B using specific rules. Once pts finished the first activity they played a game called "Choices" that is similar to "Would You Rather?" Pts will make a choice and explain why they made that decision.  Education:Teamwork, Communication, Problem-Solving, Discharge Planning  Education Outcome: Needs additional education  Clinical Observations/Feedback: Approximately 10:15am pt joined group and participated in second activity.  Marvell Fullerachel Meyer, Recreation Therapy Intern  Caroll RancherMarjette Elaine Roanhorse, LRT/CTRS

## 2017-07-23 NOTE — Plan of Care (Signed)
[facility] Therapeutic Interventions Goal: STG-Patient will identify at least five coping skills for ** STG: Coping Skills - Patient will be able to identify at least 5 coping skills for anger by conclusion of recreation therapy tx  Outcome: Adequate for Discharge Pt was able to identify some coping skills at completion of coping skills recreation therapy session.   Bobby RancherMarjette Jolean Madariaga, LRT/CTRS

## 2017-07-23 NOTE — Progress Notes (Signed)
Recreation Therapy Notes  INPATIENT RECREATION TR PLAN  Patient Details Name: Bobby [name] MRN: [id number] DOB: 16-Sep-1996 Today's Date: 07/23/2017  Rec Therapy Plan Is patient appropriate for Therapeutic Recreation?: Yes Treatment times per week: about 3 days Estimated Length of Stay: 5-7 days TR Treatment/Interventions: Group participation (Comment)  Discharge Criteria Pt will be discharged from therapy if:: Discharged Treatment plan/goals/alternatives discussed and agreed upon by:: Patient/family  Discharge Summary Short term goals set: Patient will be able to identify at least 5 coping skills for anger by conclusion of recreation therapy tx  Short term goals met: Adequate for discharge Progress toward goals comments: Groups attended Which groups?: Coping skills, Self-esteem, Other (Comment) (Team building) Reason goals not met: None Therapeutic equipment acquired: N/A Reason patient discharged from therapy: Discharge from hospital Pt/family agrees with progress & goals achieved: Yes Date patient discharged from therapy: 07/23/17   Victorino Sparrow, LRT/CTRS  Ria Comment, Kaden Daughdrill A 07/23/2017, 11:07 AM

## 2017-07-23 NOTE — Progress Notes (Signed)
Nursing Discharge Note 07/23/2017 [id number]  Data Reports sleeping good.  Did not complete self-inventory.  Attending groups, appropriate and polite with peers. Affect blunted but appropriate.  Denies HI, SI, AVH.  Received discharge orders.  Action Spoke with patient 1:1, nurse offered support to patient throughout shift.  Reviewed medications, discharge instructions, and follow up appointments with patient. Medication scripts reviewed and given to patient.  Paperwork, AVS, SRA, and transition record handed to patient- patient had difficulty repeating back why he was taking medicine and when his follow up appointment was.  Obtained verbal consent to share paperwork with mother and share follow up appointment details with mother.  Encouraged mother to get prescriptions filled today.  Escorted off of unit at 1503. Belongings returned per belongings form.  Discharged to lobby where he was met by mother.Marland Kitchen    Response Pt unable to verbalize understanding of discharge teaching; however, mother did.  Agrees to contact someone or 911 with thoughts/intent to harm self or others.    To follow up per AVS.

## 2017-07-23 NOTE — Tx Team (Signed)
Interdisciplinary Treatment and Diagnostic Plan Update  07/23/2017 Time of Session: 8:30am Bobby [name]: [id number]  Principal Diagnosis: major depressive disorder, recurrent, severe with psychosis  Secondary Diagnoses: Principal Problem:   Undifferentiated schizophrenia (HCC) Active Problems:   MDD (major depressive disorder), recurrent, severe, with psychosis (HCC)   Current Medications:  Current Facility-Administered Medications  Medication Dose Route Frequency Provider Last Rate Last Dose  . acetaminophen (TYLENOL) tablet 650 mg  650 mg Oral Q6H PRN Okonkwo, Justina A, NP      . alum & mag hydroxide-simeth (MAALOX/MYLANTA) 200-200-20 MG/5ML suspension 30 mL  30 mL Oral Q4H PRN Okonkwo, Justina A, NP      . carbamazepine (TEGRETOL) tablet 200 mg  200 mg Oral BID Okonkwo, Justina A, NP   200 mg at 07/23/17 0820  . hydrOXYzine (ATARAX/VISTARIL) tablet 25 mg  25 mg Oral TID PRN Beryle Lathekonkwo, Justina A, NP   25 mg at 07/18/17 0829  . OLANZapine zydis (ZYPREXA) disintegrating tablet 5 mg  5 mg Oral Q8H PRN Kerry HoughSimon, Spencer E, PA-C   5 mg at 07/18/17 [id number]   And  . LORazepam (ATIVAN) tablet 1 mg  1 mg Oral PRN Donell SievertSimon, Spencer E, PA-C       And  . ziprasidone (GEODON) injection 20 mg  20 mg Intramuscular PRN Donell SievertSimon, Spencer E, PA-C      . magnesium hydroxide (MILK OF MAGNESIA) suspension 30 mL  30 mL Oral Daily PRN Okonkwo, Justina A, NP      . QUEtiapine (SEROQUEL) tablet 200 mg  200 mg Oral QHS Nwoko, Agnes I, NP   200 mg at 07/22/17 2115  . traZODone (DESYREL) tablet 50 mg  50 mg Oral QHS PRN Okonkwo, Justina A, NP   50 mg at 07/16/17 2252    PTA Medications: Prescriptions Prior to Admission  Medication Sig Dispense Refill Last Dose  . albuterol (PROVENTIL HFA;VENTOLIN HFA) 108 (90 BASE) MCG/ACT inhaler Inhale 1-2 puffs into the lungs every 6 (six) hours as needed for wheezing or shortness of breath.    PRN  . HYDROcodone-acetaminophen (NORCO) 5-325 MG tablet 1-2 tabs po q6 hours prn  pain (Patient not taking: Reported on 07/13/2017) 30 tablet 0 Completed Course at Unknown time    Treatment Modalities: Medication Management, Group therapy, Case management,  1 to 1 session with clinician, Psychoeducation, Recreational therapy.  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intelligence Communication skills Physical Health Supportive family/friends  Physician Treatment Plan for Primary Diagnosis: major depressive disorder, recurrent, severe with psychosis Long Term Goal(s): Improvement in symptoms so as ready for discharge  Short Term Goals: Ability to identify changes in lifestyle to reduce recurrence of condition will improve Ability to verbalize feelings will improve Ability to demonstrate self-control will improve Ability to identify and develop effective coping behaviors will improve Compliance with prescribed medications will improve Ability to identify triggers associated with substance abuse/mental health issues will improve  Medication Management: Evaluate patient's response, side effects, and tolerance of medication regimen.  Therapeutic Interventions: 1 to 1 sessions, Unit Group sessions and Medication administration.  Evaluation of Outcomes: Adequate for Discharge  Physician Treatment Plan for Secondary Diagnosis: Principal Problem:   Undifferentiated schizophrenia (HCC) Active Problems:   MDD (major depressive disorder), recurrent, severe, with psychosis (HCC)   Long Term Goal(s): Improvement in symptoms so as ready for discharge  Short Term Goals: Ability to identify changes in lifestyle to reduce recurrence of condition will improve Ability  to verbalize feelings will improve Ability to demonstrate self-control will improve Ability to identify and develop effective coping behaviors will improve Compliance with prescribed medications will improve Ability to identify triggers  associated with substance abuse/mental health issues will improve  Medication Management: Evaluate patient's response, side effects, and tolerance of medication regimen.  Therapeutic Interventions: 1 to 1 sessions, Unit Group sessions and Medication administration.  Evaluation of Outcomes: Adequate for Discharge   RN Treatment Plan for Primary Diagnosis: major depressive disorder, recurrent, severe with psychosis Long Term Goal(s): Knowledge of disease and therapeutic regimen to maintain health will improve  Short Term Goals: Ability to demonstrate self-control, Ability to verbalize feelings will improve and Ability to identify and develop effective coping behaviors will improve  Medication Management: RN will administer medications as ordered by provider, will assess and evaluate patient's response and provide education to patient for prescribed medication. RN will report any adverse and/or side effects to prescribing provider.  Therapeutic Interventions: 1 on 1 counseling sessions, Psychoeducation, Medication administration, Evaluate responses to treatment, Monitor vital signs and CBGs as ordered, Perform/monitor CIWA, COWS, AIMS and Fall Risk screenings as ordered, Perform wound care treatments as ordered.  Evaluation of Outcomes: Adequate for Discharge   Recreational Therapy Treatment Plan for Primary Diagnosis: Undifferentiated schizophrenia (HCC) Long Term Goal(s): Patient will participate in recreation therapy treatment in at least 2 group sessions without prompting from LRT  Short Term Goals: Anger Management-Patient will be able to successfully recognize at least 2 triggers for anger and coping skills to address identified triggers  Treatment Modalities: Group and Pet Therapy  Therapeutic Interventions: Psychoeducation  Evaluation of Outcomes: Adequate for Discharge   LCSW Treatment Plan for Primary Diagnosis: major depressive disorder, recurrent, severe with psychosis Long  Term Goal(s): Safe transition to appropriate next level of care at discharge, Engage patient in therapeutic group addressing interpersonal concerns.  Short Term Goals: Engage patient in aftercare planning with referrals and resources, Identify triggers associated with mental health/substance abuse issues and Increase skills for wellness and recovery  Therapeutic Interventions: Assess for all discharge needs, 1 to 1 time with Social worker, Explore available resources and support systems, Assess for adequacy in community support network, Educate family and significant other(s) on suicide prevention, Complete Psychosocial Assessment, Interpersonal group therapy.  Evaluation of Outcomes: Adequate for Discharge   Progress in Treatment: Attending groups: Yes Participating in groups:Yes  Taking medication as prescribed: Yes, MD continues to assess for medication changes as needed Toleration medication: Yes, no side effects reported at this time Family/Significant other contact made: Yes with mother Patient understands diagnosis: Limited Discussing patient identified problems/goals with staff: Yes Medical problems stabilized or resolved: Yes Denies suicidal/homicidal ideation: Yes Issues/concerns per patient self-inventory: None Other: N/A  New problem(s) identified: None identified at this time.   New Short Term/Long Term Goal(s): None identified at this time.   Discharge Plan or Barriers: Pt will return home and follow-up with outpatient services; referral made to the [facility] 7/27:  Aftercare arranged, patient progressing and able to attend groups, minimal participation at present  Reason for Continuation of Hospitalization:    Estimated Length of Stay: D/C today  Attendees: Patient: 07/23/2017  10:13 AM  Physician: Dr. Lucianne Muss;  07/23/2017  10:13 AM  Nursing: Lanora Manis RN,  07/23/2017  10:13 AM  RN Care Manager: Onnie Boer, RN 07/23/2017  10:13 AM  Social Worker:Rod Trayshawn Durkin  07/23/2017  10:13 AM  Recreational Therapist:  07/23/2017  10:13 AM  Other: West Carbo, NP; Reola Calkins, NP  07/23/2017  10:13 AM  Other:  07/23/2017  10:13 AM  Other: 07/23/2017  10:13 AM    Scribe for Treatment Team: Richelle Itood Chelsey Kimberley, LCSW 07/23/2017 10:13 AM

## 2017-09-26 ENCOUNTER — Ambulatory Visit (HOSPITAL_COMMUNITY): Payer: Self-pay | Admitting: Psychiatry

## 2018-12-01 ENCOUNTER — Inpatient Hospital Stay (HOSPITAL_COMMUNITY)
Admission: AD | Admit: 2018-12-01 | Discharge: 2018-12-05 | DRG: 885 | Disposition: A | Discharge status: Home / Self Care | Payer: 59 | Source: Intra-hospital | Attending: Psychiatry | Admitting: Psychiatry

## 2018-12-01 ENCOUNTER — Encounter (HOSPITAL_COMMUNITY): Payer: Self-pay | Admitting: Emergency Medicine

## 2018-12-01 ENCOUNTER — Other Ambulatory Visit: Payer: Self-pay

## 2018-12-01 ENCOUNTER — Emergency Department (HOSPITAL_COMMUNITY)
Admission: EM | Admit: 2018-12-01 | Discharge: 2018-12-01 | Disposition: A | Discharge status: Other Acute Inpatient Hospital | Payer: 59 | Attending: Emergency Medicine | Admitting: Emergency Medicine

## 2018-12-01 DIAGNOSIS — F203 Undifferentiated schizophrenia: Secondary | ICD-10-CM | POA: Diagnosis present

## 2018-12-01 DIAGNOSIS — F29 Unspecified psychosis not due to a substance or known physiological condition: Secondary | ICD-10-CM | POA: Diagnosis not present

## 2018-12-01 DIAGNOSIS — F23 Brief psychotic disorder: Secondary | ICD-10-CM | POA: Diagnosis present

## 2018-12-01 DIAGNOSIS — R4701 Aphasia: Secondary | ICD-10-CM | POA: Diagnosis present

## 2018-12-01 DIAGNOSIS — R45850 Homicidal ideations: Secondary | ICD-10-CM | POA: Insufficient documentation

## 2018-12-01 DIAGNOSIS — F209 Schizophrenia, unspecified: Secondary | ICD-10-CM | POA: Diagnosis not present

## 2018-12-01 DIAGNOSIS — Z9119 Patient's noncompliance with other medical treatment and regimen: Secondary | ICD-10-CM

## 2018-12-01 DIAGNOSIS — F419 Anxiety disorder, unspecified: Secondary | ICD-10-CM | POA: Diagnosis present

## 2018-12-01 DIAGNOSIS — Z9114 Patient's other noncompliance with medication regimen: Secondary | ICD-10-CM

## 2018-12-01 DIAGNOSIS — J45909 Unspecified asthma, uncomplicated: Secondary | ICD-10-CM | POA: Diagnosis present

## 2018-12-01 LAB — CBC WITH DIFFERENTIAL/PLATELET
Abs Immature Granulocytes: 0.03 10*3/uL (ref 0.00–0.07)
BASOS PCT: 1 %
Basophils Absolute: 0.1 10*3/uL (ref 0.0–0.1)
EOS ABS: 0.4 10*3/uL (ref 0.0–0.5)
Eosinophils Relative: 4 %
HEMATOCRIT: 47.8 % (ref 39.0–52.0)
Hemoglobin: 15.4 g/dL (ref 13.0–17.0)
Immature Granulocytes: 0 %
LYMPHS ABS: 1.5 10*3/uL (ref 0.7–4.0)
Lymphocytes Relative: 16 %
MCH: 28.3 pg (ref 26.0–34.0)
MCHC: 32.2 g/dL (ref 30.0–36.0)
MCV: 87.7 fL (ref 80.0–100.0)
MONO ABS: 0.8 10*3/uL (ref 0.1–1.0)
Monocytes Relative: 8 %
Neutro Abs: 6.9 10*3/uL (ref 1.7–7.7)
Neutrophils Relative %: 71 %
PLATELETS: 357 10*3/uL (ref 150–400)
RBC: 5.45 MIL/uL (ref 4.22–5.81)
RDW: 13 % (ref 11.5–15.5)
WBC: 9.7 10*3/uL (ref 4.0–10.5)
nRBC: 0 % (ref 0.0–0.2)

## 2018-12-01 LAB — COMPREHENSIVE METABOLIC PANEL
ALBUMIN: 4.5 g/dL (ref 3.5–5.0)
ALK PHOS: 90 U/L (ref 38–126)
ALT: 30 U/L (ref 0–44)
ANION GAP: 9 (ref 5–15)
AST: 30 U/L (ref 15–41)
BUN: 13 mg/dL (ref 6–20)
CALCIUM: 9.5 mg/dL (ref 8.9–10.3)
CHLORIDE: 105 mmol/L (ref 98–111)
CO2: 22 mmol/L (ref 22–32)
Creatinine, Ser: 1.14 mg/dL (ref 0.61–1.24)
GFR calc Af Amer: >60 mL/min (ref >60)
GFR calc non Af Amer: >60 mL/min (ref >60)
GLUCOSE: 105 mg/dL — AB (ref 70–99)
POTASSIUM: 4 mmol/L (ref 3.5–5.1)
SODIUM: 136 mmol/L (ref 135–145)
Total Bilirubin: 0.8 mg/dL (ref 0.3–1.2)
Total Protein: 8.4 g/dL — ABNORMAL HIGH (ref 6.5–8.1)

## 2018-12-01 LAB — RAPID URINE DRUG SCREEN, HOSP PERFORMED
AMPHETAMINES: NOT DETECTED
BENZODIAZEPINES: NOT DETECTED
Barbiturates: NOT DETECTED
Cocaine: NOT DETECTED
OPIATES: NOT DETECTED
TETRAHYDROCANNABINOL: NOT DETECTED

## 2018-12-01 LAB — ACETAMINOPHEN LEVEL: Acetaminophen (Tylenol), Serum: <10 ug/mL — ABNORMAL LOW (ref 10–30)

## 2018-12-01 LAB — ETHANOL: Alcohol, Ethyl (B): <10 mg/dL (ref <10)

## 2018-12-01 LAB — SALICYLATE LEVEL: Salicylate Lvl: <7 mg/dL (ref 2.8–30.0)

## 2018-12-01 MED ORDER — TRAZODONE HCL 50 MG PO TABS
50.0000 mg | ORAL_TABLET | Freq: Every evening | ORAL | Status: DC | PRN
  Filled 2018-12-01: qty 3

## 2018-12-01 MED ORDER — MAGNESIUM HYDROXIDE 400 MG/5ML PO SUSP: 30.0000 mL | Freq: Every day | ORAL | Status: DC | PRN

## 2018-12-01 MED ORDER — ALUM & MAG HYDROXIDE-SIMETH 200-200-20 MG/5ML PO SUSP: 30.0000 mL | ORAL | Status: DC | PRN

## 2018-12-01 MED ORDER — HYDROXYZINE HCL 25 MG PO TABS: 25.0000 mg | ORAL_TABLET | Freq: Three times a day (TID) | ORAL | Status: DC | PRN

## 2018-12-01 MED ORDER — ACETAMINOPHEN 325 MG PO TABS: 650.0000 mg | ORAL_TABLET | Freq: Four times a day (QID) | ORAL | Status: DC | PRN

## 2018-12-01 MED ORDER — ACETAMINOPHEN 325 MG PO TABS: 650.0000 mg | ORAL_TABLET | ORAL | Status: DC | PRN

## 2018-12-01 MED ORDER — ALUM & MAG HYDROXIDE-SIMETH 200-200-20 MG/5ML PO SUSP: 30.0000 mL | Freq: Four times a day (QID) | ORAL | Status: DC | PRN

## 2018-12-01 NOTE — ED Notes (Addendum)
[facility] Nira ConnJason Berry accepting NP. Dr Maralyn SagoSarah attending. [phone]. Arrive by 4am.

## 2018-12-01 NOTE — ED Notes (Signed)
Family member leaving

## 2018-12-01 NOTE — ED Notes (Signed)
Pt left with [facility].  
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
## 2018-12-01 NOTE — ED Notes (Signed)
Pt was d/c from unit. Unable to readmit. [facility] and [facility] aware

## 2018-12-01 NOTE — Progress Notes (Signed)
CSW update Kayla @[facility]  ED of disposition error: [facility] may arrived [facility] as soon as transportation can be arranged. Preferably, before 4am.   Wells GuilesSarah Dontrez Pettis, LCSW, LCAS Disposition CSW [facility] [phone]

## 2018-12-01 NOTE — ED Notes (Signed)
Kathie RhodesBetty [phone]- grandma. Please call for updates
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
## 2018-12-01 NOTE — ED Notes (Signed)
Gave patient meal tray.

## 2018-12-01 NOTE — ED Notes (Signed)
Printed carelink transfer [facility]

## 2018-12-01 NOTE — ED Notes (Signed)
Attempted to call grandmother. Left voicemail

## 2018-12-01 NOTE — ED Notes (Signed)
Updated grandmother regarding inpatient placement
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
## 2018-12-02 ENCOUNTER — Encounter (HOSPITAL_COMMUNITY): Payer: Self-pay

## 2018-12-02 ENCOUNTER — Other Ambulatory Visit: Payer: Self-pay

## 2018-12-02 DIAGNOSIS — F209 Schizophrenia, unspecified: Secondary | ICD-10-CM

## 2018-12-02 MED ORDER — HALOPERIDOL LACTATE 5 MG/ML IJ SOLN: 5.0000 mg | Freq: Four times a day (QID) | INTRAMUSCULAR | Status: DC | PRN

## 2018-12-02 MED ORDER — MIRTAZAPINE 30 MG PO TBDP
30.0000 mg | ORAL_TABLET | Freq: Every day | ORAL | Status: DC
  Filled 2018-12-02: qty 1
  Filled 2018-12-02: qty 2
  Filled 2018-12-02 (×4): qty 1

## 2018-12-02 MED ORDER — LORAZEPAM 1 MG PO TABS
1.0000 mg | ORAL_TABLET | Freq: Three times a day (TID) | ORAL | Status: AC
  Administered 2018-12-02: 1 mg via ORAL
  Filled 2018-12-02: qty 1

## 2018-12-02 MED ORDER — BENZTROPINE MESYLATE 0.5 MG PO TABS
0.5000 mg | ORAL_TABLET | Freq: Two times a day (BID) | ORAL | Status: DC
  Administered 2018-12-02 – 2018-12-05 (×7): 0.5 mg via ORAL
  Filled 2018-12-02 (×13): qty 1

## 2018-12-02 MED ORDER — ENOXAPARIN SODIUM 30 MG/0.3ML ~~LOC~~ SOLN
30.0000 mg | SUBCUTANEOUS | Status: DC
  Filled 2018-12-02 (×3): qty 0.3

## 2018-12-02 MED ORDER — RISPERIDONE 1 MG PO TBDP
3.0000 mg | ORAL_TABLET | Freq: Two times a day (BID) | ORAL | Status: DC
  Administered 2018-12-02 – 2018-12-05 (×7): 3 mg via ORAL
  Filled 2018-12-02 (×13): qty 3

## 2018-12-02 NOTE — Progress Notes (Signed)
Recreation Therapy Notes  Date: 12.10.19 Time: 1000 Location: 500 Hall Dayroom  Group Topic: Wellness  Goal Area(s) Addresses:  Patient will define components of whole wellness. Patient will verbalize benefit of whole wellness.  Intervention: Music  Activity: Exercise.  LRT and patients went through a series of stretches to loosen up their muscles and relieve some stress from their bodies.  Education: Wellness, Discharge Planning.   Education Outcome: Acknowledges education/In group clarification offered/Needs additional education.   Clinical Observations/Feedback:  Pt did not attend group.     Glendell Schlottman, LRT/CTRS         Garry Nicolini A 12/02/2018 11:19 AM 
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
## 2018-12-02 NOTE — [facility] Group Notes (Signed)
LCSW Group Therapy Note 12/02/2018 12:20 PM  Type of Therapy and Topic: Group Therapy: DBT House  Participation Level: Minimal   Description of Group:  In this group patients will be encouraged to explore  their values, behaviors they want to change, emotions they wish to increase, protective factors, supports, coping skills, and motivational factors. They will be guided to discuss their thoughts, feelings, and behaviors related to these obstacles. The group will be asked to individually process the activity and share their insights with the group. This group will be process-oriented, with patients participating in exploration of their own experiences as well as giving and receiving support and challenge from other group members.   Therapeutic Goals: 1. Patient will identify their values 2. Patient will identify behaviors they wish to modify  3. Patient will identify feelings and emotions they wish to increase 4. Patient will identify strengths, supports, protective factors 5. Patient will identify coping skills 6. Patient will identify goals and motivating factors for change   Summary of Patient Progress Jacelyn Piiles attended group and listened quietly. Jacelyn Piiles introduced himself by name, but did not speak otherwise.   Therapeutic Modalities:  Dialectical Behavioral Therapy  Motivational Interviewing Relapse Prevention Therapy
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
## 2018-12-02 NOTE — BHH Counselor (Signed)
Adult Comprehensive Assessment  Patient ID: ROBERTA KELLY, male   DOB: 1996/02/14, 22 y.o.   MRN: [id number]   CSW met with patient at bedside, other than answering that he lives with his grandmother in [place], the patient responded "I don't know" to every question. Including "what was going on before you came to the hospital, what are your goals during this hospitalization, how will you know when you are ready to go home?"The patient did not give verbal or written consent to CSW contacting a family member at this time.   The following is patient's assessment from July 2018. CSW to update assessment with patient at a later date or with family member if permission is granted.   Information Source: Information source: Patient  Current Stressors:  Educational / Learning stressors: N/A Employment / Job issues: N/A Family Relationships: None reported by patient. Patient reports having a very supportive family Museum/gallery curator / Lack of resources (include bankruptcy): N/A Housing / Lack of housing: N/A Physical health (include injuries & life threatening diseases): N/A Social relationships: Patient reports finding it hard to make friends Substance abuse: None reported  Bereavement / Loss: N/A  Living/Environment/Situation:  Living Arrangements: Parent, Other relatives Living conditions (as described by patient or guardian): Patient lives in the home with his mother, stepfather and 65 year old sister.  How long has patient lived in current situation?: Patient reports he has been living with his family for 6 years. Patient reports living with father in [place], staying with a friend in [place], and living with back and forth with girlfriend in [place] prior to moving back in with mother.  What is atmosphere in current home: Loving, Supportive  Family History:  Marital status: Single What is your sexual orientation?: Heterosexual; Interested in Women Has your sexual activity been  affected by drugs, alcohol, medication, or emotional stress?: N/A Does patient have children?: No  Childhood History:  By whom was/is the patient raised?: Mother Additional childhood history information: Patient reports father left family when he was about 19 years old. Patient reports mother has always been supportive of patient and they have a good relationship. Patient reports some anger regarding father living them at a young age.  Description of patient's relationship with caregiver when they were a child: Patient reports having a good upbringing by mother.  Patient's description of current relationship with people who raised him/her: Patient reports a good relationship with his mother.  How were you disciplined when you got in trouble as a child/adolescent?: Unable to assess Does patient have siblings?: Yes Number of Siblings: 1 Description of patient's current relationship with siblings: Patient reports having an 80 year old sister. Patient reports they have a close relationship however his anger can get into the way of their relationship.  Did patient suffer any verbal/emotional/physical/sexual abuse as a child?: No Did patient suffer from severe childhood neglect?: No Has patient ever been sexually abused/assaulted/raped as an adolescent or adult?: No Was the patient ever a victim of a crime or a disaster?: No Witnessed domestic violence?: No Has patient been effected by domestic violence as an adult?: No  Education:  Highest grade of school patient has completed: 12th Currently a student?: No Name of school: N/A Learning disability?: No  Employment/Work Situation:   Employment situation: Employed Where is patient currently employed?: Patient currently works at [employer] long has patient been employed?: Patient reports he has been working at [employer] for 3 months  Patient's job has been impacted by current illness:  No What is the longest time patient has a held a job?: 3  months Where was the patient employed at that time?: [employer] 3 months  Has patient ever been in the [employer]?: No Has patient ever served in combat?: No Did You Receive Any Psychiatric Treatment/Services While in Passenger transport manager?: No Are There Guns or Other Weapons in [place]?: No Are These Weapons Safely Secured?: Yes  Financial Resources:   Museum/gallery curator resources: [employer], Income from employment Does patient have a Programmer, applications or guardian?: No  Alcohol/Substance Abuse:   If attempted suicide, did drugs/alcohol play a role in this?: No Alcohol/Substance Abuse Treatment Hx: Denies past history Has alcohol/substance abuse ever caused legal problems?: No  Social Support System:   Patient's Community Support System: Good Describe Community Support System: Patient reports he has good family support.  Type of faith/religion: Christianity How does patient's faith help to cope with current illness?: When asked this question, patient reported "he plays basketball".   Leisure/Recreation:   Leisure and Hobbies: Patient reports he loves to play basketball and football.   Strengths/Needs:   What things does the patient do well?: Patient reports he is very athletic. Patient reports he is also very good at math and reading.  In what areas does patient struggle / problems for patient: Patient reports he has a hard time maintaining friendships.  Discharge Plan:   Does patient have access to transportation?: Yes Will patient be returning to same living situation after discharge?: Yes Currently receiving community mental health services: No If no, would patient like referral for services when discharged?: Yes (What county?) [facility]) Does patient have financial barriers related to discharge medications?: No  Summary/Recommendations:   Summary and Recommendations (to be completed by the evaluator): Patient is a 22 year old male from [place]. Patient is  diagnosed with schizophrenia and reports he does not know why he is in the hospital. Patient was brought to the ED following noncompliance with medications and refusing to attend outpatient treatment. During assessment in the ED, patient became mute and appeared to be responding to internal stimuli. CSW attempted to assess patient, patient responded only with "I don't know."  Patient has one prior [facility] admission in July 2018. Patient IVC'd by grandmother. Patient would benefit from crisis stabilization, medication management, therapeutic milieu, referral for outpatient follow up.  Joellen Jersey. 12/02/2018

## 2018-12-02 NOTE — Tx Team (Signed)
Initial Treatment Plan 12/02/2018 2:35 AM Bobby [name] ZOX:[id number]    PATIENT STRESSORS: Marital or family conflict Medication change or noncompliance   PATIENT STRENGTHS: General fund of knowledge Supportive family/friends   PATIENT IDENTIFIED PROBLEMS: "dont know why I am here "  "didn't try to hurt myself"                   DISCHARGE CRITERIA:  Improved stabilization in mood, thinking, and/or behavior Medical problems require only outpatient monitoring Reduction of life-threatening or endangering symptoms to within safe limits Verbal commitment to aftercare and medication compliance  PRELIMINARY DISCHARGE PLAN: Outpatient therapy Return to previous living arrangement  PATIENT/FAMILY INVOLVEMENT: This treatment plan has been presented to and reviewed with the patient, Bobby Hissiles Q Carswell, and/or family member, .  The patient and family have been given the opportunity to ask questions and make suggestions.  Andrena Mewsuttall, Demarie Uhlig J, RN 12/02/2018, 2:35 AM
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
## 2018-12-02 NOTE — Plan of Care (Signed)
Problem: Safety: Goal: Periods of time without injury will increase Outcome: Progressing   Problem: Activity: Goal: Will verbalize the importance of balancing activity with adequate rest periods Outcome: Progressing   Problem: Nutritional: Goal: Ability to achieve adequate nutritional intake will improve Outcome: Progressing DAR Note: Pt observed in bed on initial contact. A & O to self and place "hospital".  Denies SI, HI, AVH and pain. Presents childlike with fixed smile, guarded, thought blocking with elective mutism at intervals during conversations. Visible in hall pacing this afternoon. Compliant with medications when offered. Denies side effects. Pt attended 25% of groups this shift.  Encouraged pt to be more ambulatory on and off unit to avoids DVTs,  voice concerns, attend to ADLs and comply with current treatment regimen including groups. All medications administered as ordered with verbal education and effects monitored. Pt tolerated all meals and fluids well. Off unit for meals, returned without issues. Cooperative with care. Denies concerns at this time. Safety maintained on and off unit without outburst to note thus far.

## 2018-12-03 MED ORDER — OMEGA-3-ACID ETHYL ESTERS 1 G PO CAPS
1.0000 g | ORAL_CAPSULE | Freq: Two times a day (BID) | ORAL | Status: DC
  Administered 2018-12-03 – 2018-12-05 (×4): 1 g via ORAL
  Filled 2018-12-03 (×8): qty 1

## 2018-12-03 NOTE — Progress Notes (Signed)
Nursing Progress Note: 7p-7a D: Pt currently presents with a pleasant/preoccuppied affect and behavior. Not Interacting with the milieu. Pt reports poor sleep during the previous night with current medication regimen. Pt did not attend wrap-up group.  A: Pt refused medications per providers orders. Pt's labs and vitals were monitored throughout the night. Pt supported emotionally and encouraged to express concerns and questions. Pt educated on medications.  R: Pt's safety ensured with 15 minute and environmental checks. Pt currently denies SI, HI, and VH and endorses AH. Pt verbally contracts to seek staff if SI,HI, or AVH occurs and to consult with staff before acting on any harmful thoughts. Will continue to monitor.

## 2018-12-03 NOTE — Progress Notes (Signed)
[facility] Progress Note  12/03/2018 10:44 AM Bobby [name]  MRN:  [id number] Subjective:    Bobby [name] is 22 years of age he was admitted due to an exacerbation in his underlying psychotic disorder, he was noncompliant, non-cooperative when attempting to get [facility] and required petition for involuntary commitment On presentation yesterday he was mute and catatonic but his speech did return later on in the day and he began to move around so we discontinued the anticoagulation therapy. This morning he is able to make some eye contact and speak to me and gives me short meaningful answers although he does not have a lot of detail.  For example he knows he is hospitalized but does not remember coming here and does not know why he is here.  He acknowledges a history of mental illness but cannot elaborate any medications has been on what he is been treated for what his diagnosis is. Overall he is guarded and flat but he denies hearing things and seeing things seems to understand the questions.  Involuntary movements no EPS or TD Not describe himself as depressed and does not have a history of manic symptoms as best he can tell when he does seem to understand what that type of description is.  Was seen with the treatment team and stated his goal was to simply be clear in his thinking and so many words he did not articulated that well but prolonged period time could not state a goal  Principal Problem: <principal problem not specified> Diagnosis: Active Problems:   Schizophrenia, acute (HCC)  Total Time spent with patient: 20 minutes  Past Medical History:  Past Medical History:  Diagnosis Date  . Asthma    prn inhaler  . Rupture of radial collateral ligament of right thumb 11/2015    Past Surgical History:  Procedure Laterality Date  . LIGAMENT REPAIR Right 12/15/2015   Procedure: RIGHT THUMB RADIAL COLLATERAL LIGAMENT REPAIR;  Surgeon: Betha LoaKevin Kuzma, MD;  Location: [facility];  Service: Orthopedics;  Laterality: Right;   Family History: History reviewed. No pertinent family history.  Social History:  Social History   Substance and Sexual Activity  Alcohol Use No     Social History   Substance and Sexual Activity  Drug Use Not Currently  . Types: Marijuana    Social History   Socioeconomic History  . Marital status: Single    Spouse name: Not on file  . Number of children: Not on file  . Years of education: Not on file  . Highest education level: Not on file  Occupational History  . Not on file  Social Needs  . Financial resource strain: Not on file  . Food insecurity:    Worry: Not on file    Inability: Not on file  . Transportation needs:    Medical: Not on file    Non-medical: Not on file  Tobacco Use  . Smoking status: Never Smoker  . Smokeless tobacco: Never Used  Substance and Sexual Activity  . Alcohol use: No  . Drug use: Not Currently    Types: Marijuana  . Sexual activity: Not on file  Lifestyle  . Physical activity:    Days per week: Not on file    Minutes per session: Not on file  . Stress: Not on file  Relationships  . Social connections:    Talks on phone: Not on file    Gets together: Not on file  Attends religious service: Not on file    Active member of club or organization: Not on file    Attends meetings of clubs or organizations: Not on file    Relationship status: Not on file  Other Topics Concern  . Not on file  Social History Narrative  . Not on file   Additional Social History:    Pain Medications: None Prescriptions: None.  Pt says he is not on any medication. Over the Counter: Pt says he is not on any medication. Longest period of sobriety (when/how long): denies use                    Sleep: Good  Appetite:  Fair  Current Medications: Current Facility-Administered Medications  Medication Dose Route Frequency Provider Last Rate Last Dose  . acetaminophen (TYLENOL)  tablet 650 mg  650 mg Oral Q6H PRN Jackelyn Poling, NP      . alum & mag hydroxide-simeth (MAALOX/MYLANTA) 200-200-20 MG/5ML suspension 30 mL  30 mL Oral Q4H PRN Nira Conn A, NP      . benztropine (COGENTIN) tablet 0.5 mg  0.5 mg Oral BID Malvin Johns, MD   0.5 mg at 12/03/18 0757  . haloperidol lactate (HALDOL) injection 5 mg  5 mg Intramuscular Q6H PRN Malvin Johns, MD      . hydrOXYzine (ATARAX/VISTARIL) tablet 25 mg  25 mg Oral TID PRN Jackelyn Poling, NP      . magnesium hydroxide (MILK OF MAGNESIA) suspension 30 mL  30 mL Oral Daily PRN Nira Conn A, NP      . mirtazapine (REMERON SOL-TAB) disintegrating tablet 30 mg  30 mg Oral QHS Malvin Johns, MD      . risperiDONE (RISPERDAL M-TABS) disintegrating tablet 3 mg  3 mg Oral BID Malvin Johns, MD   3 mg at 12/03/18 0757  . traZODone (DESYREL) tablet 50 mg  50 mg Oral QHS PRN Jackelyn Poling, NP        Lab Results:  Results for orders placed or performed during the hospital encounter of 12/01/18 (from the past 48 hour(s))  Urine rapid drug screen (hosp performed)     Status: None   Collection Time: 12/01/18  4:13 PM  Result Value Ref Range   Opiates NONE DETECTED NONE DETECTED   Cocaine NONE DETECTED NONE DETECTED   Benzodiazepines NONE DETECTED NONE DETECTED   Amphetamines NONE DETECTED NONE DETECTED   Tetrahydrocannabinol NONE DETECTED NONE DETECTED   Barbiturates NONE DETECTED NONE DETECTED    Comment: (NOTE) DRUG SCREEN FOR MEDICAL PURPOSES ONLY.  IF CONFIRMATION IS NEEDED FOR ANY PURPOSE, NOTIFY LAB WITHIN 5 DAYS. LOWEST DETECTABLE LIMITS FOR URINE DRUG SCREEN Drug Class                     Cutoff (ng/mL) Amphetamine and metabolites    1000 Barbiturate and metabolites    200 Benzodiazepine                 200 Tricyclics and metabolites     300 Opiates and metabolites        300 Cocaine and metabolites        300 THC                            50 Performed at [facility], [address], [place], [place] [address]    Comprehensive metabolic panel     Status: Abnormal  Collection Time: 12/01/18  4:22 PM  Result Value Ref Range   Sodium 136 135 - 145 mmol/L   Potassium 4.0 3.5 - 5.1 mmol/L   Chloride 105 98 - 111 mmol/L   CO2 22 22 - 32 mmol/L   Glucose, Bld 105 (H) 70 - 99 mg/dL   BUN 13 6 - 20 mg/dL   Creatinine, Ser 7.84 0.61 - 1.24 mg/dL   Calcium 9.5 8.9 - 69.6 mg/dL   Total Protein 8.4 (H) 6.5 - 8.1 g/dL   Albumin 4.5 3.5 - 5.0 g/dL   AST 30 15 - 41 U/L   ALT 30 0 - 44 U/L   Alkaline Phosphatase 90 38 - 126 U/L   Total Bilirubin 0.8 0.3 - 1.2 mg/dL   GFR calc non Af Amer >60 >60 mL/min   GFR calc Af Amer >60 >60 mL/min   Anion gap 9 5 - 15    Comment: Performed at [facility], [address] [place], [place] [address]  CBC with Differential     Status: None   Collection Time: 12/01/18  4:22 PM  Result Value Ref Range   WBC 9.7 4.0 - 10.5 K/uL   RBC 5.45 4.22 - 5.81 MIL/uL   Hemoglobin 15.4 13.0 - 17.0 g/dL   HCT 41.3 24.4 - 01.0 %   MCV 87.7 80.0 - 100.0 fL   MCH 28.3 26.0 - 34.0 pg   MCHC 32.2 30.0 - 36.0 g/dL   RDW 27.2 53.6 - 64.4 %   Platelets 357 150 - 400 K/uL   nRBC 0.0 0.0 - 0.2 %   Neutrophils Relative % 71 %   Neutro Abs 6.9 1.7 - 7.7 K/uL   Lymphocytes Relative 16 %   Lymphs Abs 1.5 0.7 - 4.0 K/uL   Monocytes Relative 8 %   Monocytes Absolute 0.8 0.1 - 1.0 K/uL   Eosinophils Relative 4 %   Eosinophils Absolute 0.4 0.0 - 0.5 K/uL   Basophils Relative 1 %   Basophils Absolute 0.1 0.0 - 0.1 K/uL   Immature Granulocytes 0 %   Abs Immature Granulocytes 0.03 0.00 - 0.07 K/uL    Comment: Performed at [facility], [address], [place], [place] [address]  Acetaminophen level     Status: Abnormal   Collection Time: 12/01/18  4:22 PM  Result Value Ref Range   Acetaminophen (Tylenol), Serum <10 (L) 10 - 30 ug/mL    Comment: (NOTE) Therapeutic concentrations vary significantly. A range of 10-30 ug/mL  may be an effective concentration for many patients. However, some   are best treated at concentrations outside of this range. Acetaminophen concentrations >150 ug/mL at 4 hours after ingestion  and >50 ug/mL at 12 hours after ingestion are often associated with  toxic reactions. Performed at [facility], [address], [place], [place] [address]   Ethanol     Status: None   Collection Time: 12/01/18  4:22 PM  Result Value Ref Range   Alcohol, Ethyl (B) <10 <10 mg/dL    Comment: Performed at [facility], [address], [place], [place] [address]  Salicylate level     Status: None   Collection Time: 12/01/18  4:22 PM  Result Value Ref Range   Salicylate Lvl <7.0 2.8 - 30.0 mg/dL    Comment: Performed at [facility], [address] [place], [place] [address]    Blood Alcohol level:  Lab Results  Component Value Date   ETH <10 12/01/2018   ETH 108 (  H) 07/12/2017    Metabolic Disorder Labs: Lab Results  Component Value Date   HGBA1C 5.5 07/18/2017   MPG 111 07/18/2017   MPG 103 07/17/2017   Lab Results  Component Value Date   PROLACTIN 51.8 (H) 07/18/2017   PROLACTIN 36.8 (H) 07/17/2017   Lab Results  Component Value Date   CHOL 161 07/18/2017   TRIG 127 07/18/2017   HDL 76 07/18/2017   CHOLHDL 2.1 07/18/2017   VLDL 25 07/18/2017   LDLCALC 60 07/18/2017   LDLCALC 69 07/17/2017    Physical Findings: AIMS: Facial and Oral Movements Muscles of Facial Expression: None, normal Lips and Perioral Area: None, normal Jaw: None, normal Tongue: None, normal,Extremity Movements Upper (arms, wrists, hands, fingers): None, normal Lower (legs, knees, ankles, toes): None, normal, Trunk Movements Neck, shoulders, hips: None, normal, Overall Severity Severity of abnormal movements (highest score from questions above): None, normal Incapacitation due to abnormal movements: None, normal Patient's awareness of abnormal movements (rate only patient's report): No Awareness, Dental Status Current problems with teeth and/or dentures?: No Does  patient usually wear dentures?: No  CIWA:    COWS:     Musculoskeletal: Strength & Muscle Tone: within normal limits Gait & Station: normal Patient leans: N/A  Psychiatric Specialty Exam: Physical Exam  ROS  Blood pressure 119/81, pulse 83, temperature 98.8 F (37.1 C), temperature source Oral, resp. rate 15, height 5\' 9"  (1.753 m), weight 87.1 kg.Body mass index is 28.35 kg/m.  General Appearance: Casual  Eye Contact:  Fair  Speech:  Slow  Volume:  Decreased  Mood:  Dysphoric  Affect:  Blunt and Congruent  Thought Process:  Disorganized  Orientation:  Full (Time, Place, and Person)  Thought Content:  Probably some degree of thought blocking coming out of slowness of speech  Suicidal Thoughts:  No  Homicidal Thoughts:  No  Memory:  NA Immediate;   Poor  Judgement:  Fair  Insight:  Shallow  Psychomotor Activity:  Normal  Concentration:  Concentration: Fair  Recall:  Fair  Fund of Knowledge:  Fair  Language:  Good  Akathisia:  Negative  Handed:  Right  AIMS (if indicated):     Assets:  Physical Health  ADL's:  Intact  Cognition:  WNL  Sleep:  Number of Hours: 6.75   In summary some improvement already for this 22 oh patient with a untreated psychotic disorder due to noncompliance but showing improvement still not very verbal but no longer mute and catatonic understand his grandmother wants to talk to me and he does agree to let me talk to her  Treatment Plan Summary: Daily contact with patient to assess and evaluate symptoms and progress in treatment and Medication management  Malvin Johns, MD 12/03/2018, 10:44 AM

## 2018-12-03 NOTE — Progress Notes (Signed)
Pt did not attend wrap-up group   

## 2018-12-03 NOTE — Progress Notes (Signed)
Recreation Therapy Notes  Date: 12.11.19 Time: 1000 Location: 500 Hall Dayroom   Group Topic: Communication, Team Building, Problem Solving  Goal Area(s) Addresses:  Patient will effectively work with peer towards shared goal.  Patient will identify skill used to make activity successful.  Patient will identify how skills used during activity can be used to reach post d/c goals.   Intervention: STEM Activity   Activity: Elevated Bridge. Patients were provided the following materials: 20 straws and a long piece of masking tape.  Patients were divided into teams of 2.  Each group was to build an elevated that would hold a small paperback book.  Education: Social Skills, Discharge Planning.   Education Outcome: Acknowledges education/In group clarification offered/Needs additional education.   Clinical Observations/Feedback: Pt did not attend group.    Lucetta Baehr, LRT/CTRS         Clarke Peretz A 12/03/2018 11:30 AM 

## 2018-12-03 NOTE — Plan of Care (Signed)
Progress note  D: pt found in bed; compliant with medication administration. Pt states he slept well. Pt denies any si/hi/ah/vh and verbally agrees to approach staff if these become apparent or before [facility]. Pt does seem to be thought blocking at times. Pt is flat in affect and minimal with his interactions. Pt denies any pain, rating this a 0/10.  A: pt provided support and encouragement. Pt given medication per protocol and standing orders. Q1555m safety checks implemented and continued. R: pt safe on the unit. Will continue to monitor.   Pt progressing in the following metrics  Problem: Education: Goal: Knowledge of [place] [facility] Education information/materials will improve Outcome: Progressing Goal: Emotional status will improve Outcome: Progressing Goal: Mental status will improve Outcome: Progressing Goal: Verbalization of understanding the information provided will improve Outcome: Progressing

## 2018-12-03 NOTE — [facility] Group Notes (Signed)
[facility] Group Notes:  (Nursing/MHT/Case Management/Adjunct)  Date:  12/03/2018  Time:  1:15 PM  Type of Therapy:  Nurse Education  Participation Level:  Did Not Attend   Bobby MiyamotoMichael R Lada [name] 12/03/2018, 3:57 PM

## 2018-12-03 NOTE — Tx Team (Signed)
Interdisciplinary Treatment and Diagnostic Plan Update  12/03/2018 Time of Session: 9:00am Bobby [name] MRN: [id number]  Principal Diagnosis: Psychosis, including current mutism, recent erratic behaviors, in the context of noncompliance with antipsychotic medications/negative drug screen  Secondary Diagnoses: Active Problems:   Schizophrenia, acute ([place])   Current Medications:  Current Facility-Administered Medications  Medication Dose Route Frequency Provider Last Rate Last Dose  . acetaminophen (TYLENOL) tablet 650 mg  650 mg Oral Q6H PRN Rozetta Nunnery, NP      . alum & mag hydroxide-simeth (MAALOX/MYLANTA) 200-200-20 MG/5ML suspension 30 mL  30 mL Oral Q4H PRN Lindon Romp A, NP      . benztropine (COGENTIN) tablet 0.5 mg  0.5 mg Oral BID Johnn Hai, MD   0.5 mg at 12/03/18 0757  . haloperidol lactate (HALDOL) injection 5 mg  5 mg Intramuscular Q6H PRN Johnn Hai, MD      . hydrOXYzine (ATARAX/VISTARIL) tablet 25 mg  25 mg Oral TID PRN Rozetta Nunnery, NP      . magnesium hydroxide (MILK OF MAGNESIA) suspension 30 mL  30 mL Oral Daily PRN Lindon Romp A, NP      . mirtazapine (REMERON SOL-TAB) disintegrating tablet 30 mg  30 mg Oral QHS Johnn Hai, MD      . risperiDONE (RISPERDAL M-TABS) disintegrating tablet 3 mg  3 mg Oral BID Johnn Hai, MD   3 mg at 12/03/18 0757  . traZODone (DESYREL) tablet 50 mg  50 mg Oral QHS PRN Rozetta Nunnery, NP       PTA Medications: Medications Prior to Admission  Medication Sig Dispense Refill Last Dose  . Aspirin-Salicylamide-Caffeine (BC HEADACHE) 325-95-16 MG TABS Take 1 packet by mouth daily as needed (for pain).   Past Week at Unknown time    Patient Stressors: Marital or family conflict Medication change or noncompliance  Patient Strengths: General fund of knowledge Supportive family/friends  Treatment Modalities: Medication Management, Group therapy, Case management,  1 to 1 session with clinician, Psychoeducation,  Recreational therapy.   Physician Treatment Plan for Primary Diagnosis: <principal problem not specified> Long Term Goal(s): Improvement in symptoms so as ready for discharge Improvement in symptoms so as ready for discharge   Short Term Goals: Compliance with prescribed medications will improve Compliance with prescribed medications will improve  Medication Management: Evaluate patient's response, side effects, and tolerance of medication regimen.  Therapeutic Interventions: 1 to 1 sessions, Unit Group sessions and Medication administration.  Evaluation of Outcomes: Not Met  Physician Treatment Plan for Secondary Diagnosis: Active Problems:   Schizophrenia, acute ([place])  Long Term Goal(s): Improvement in symptoms so as ready for discharge Improvement in symptoms so as ready for discharge   Short Term Goals: Compliance with prescribed medications will improve Compliance with prescribed medications will improve     Medication Management: Evaluate patient's response, side effects, and tolerance of medication regimen.  Therapeutic Interventions: 1 to 1 sessions, Unit Group sessions and Medication administration.  Evaluation of Outcomes: Not Met   RN Treatment Plan for Primary Diagnosis: <principal problem not specified> Long Term Goal(s): Knowledge of disease and therapeutic regimen to maintain health will improve  Short Term Goals: Ability to remain free from injury will improve, Ability to verbalize frustration and anger appropriately will improve, Ability to verbalize feelings will improve, Ability to disclose and discuss suicidal ideas and Compliance with prescribed medications will improve  Medication Management: RN will administer medications as ordered by provider, will assess and evaluate patient's response and provide  education to patient for prescribed medication. RN will report any adverse and/or side effects to prescribing provider.  Therapeutic Interventions: 1 on 1  counseling sessions, Psychoeducation, Medication administration, Evaluate responses to treatment, Monitor vital signs and CBGs as ordered, Perform/monitor CIWA, COWS, AIMS and Fall Risk screenings as ordered, Perform wound care treatments as ordered.  Evaluation of Outcomes: Not Met   LCSW Treatment Plan for Primary Diagnosis: <principal problem not specified> Long Term Goal(s): Safe transition to appropriate next level of care at discharge, Engage patient in therapeutic group addressing interpersonal concerns.  Short Term Goals: Engage patient in aftercare planning with referrals and resources, Increase social support, Increase ability to appropriately verbalize feelings, Increase emotional regulation and Increase skills for wellness and recovery  Therapeutic Interventions: Assess for all discharge needs, 1 to 1 time with Social worker, Explore available resources and support systems, Assess for adequacy in community support network, Educate family and significant other(s) on suicide prevention, Complete Psychosocial Assessment, Interpersonal group therapy.  Evaluation of Outcomes: Not Met   Progress in Treatment: Attending groups: Yes. Participating in groups: No. Taking medication as prescribed: Yes. Toleration medication: Yes. Family/Significant other contact made: No, will contact:  patient does not give consent at this time Patient understands diagnosis: No. Discussing patient identified problems/goals with staff: No. Medical problems stabilized or resolved: No. Denies suicidal/homicidal ideation: Yes. Issues/concerns per patient self-inventory: No.  New problem(s) identified: Patient does not respond to staff at all or says only "I don't know." Difficult to assess, no goals at this time. Unsure why he is here.  New Short Term/Long Term Goal(s): medication management for mood stabilization; elimination of SI thoughts; development of comprehensive mental wellness/sobriety  plan.  Patient Goals:  "I don't know."  Discharge Plan or Barriers: CSW continuing to assess, was living with grandmother. [place] pamphlet, [facility] Crisis information, and information provided to patient for additional community support and resources.   Reason for Continuation of Hospitalization: Anxiety Delusions  Depression Hallucinations Medication stabilization Suicidal ideation  Estimated Length of Stay: 3-5 days  Attendees: Patient: Bobby [name] 12/03/2018 9:49 AM  Physician: Dr.Farah 12/03/2018 9:49 AM  Nursing: Legrand Como 12/03/2018 9:49 AM  RN Care Manager: 12/03/2018 9:49 AM  Social Worker: Stephanie Acre, [place] 12/03/2018 9:49 AM  Recreational Therapist:  12/03/2018 9:49 AM  Other:  12/03/2018 9:49 AM  Other:  12/03/2018 9:49 AM  Other: 12/03/2018 9:49 AM    Scribe for Treatment Team: Joellen Jersey, [place] 12/03/2018 9:49 AM

## 2018-12-04 MED ORDER — PRENATAL MULTIVITAMIN CH
1.0000 | ORAL_TABLET | Freq: Every day | ORAL | Status: DC
  Administered 2018-12-04 – 2018-12-05 (×2): 1 via ORAL
  Filled 2018-12-04 (×3): qty 1

## 2018-12-04 MED ORDER — ARIPIPRAZOLE 2 MG PO TABS
2.0000 mg | ORAL_TABLET | Freq: Every day | ORAL | Status: DC
  Administered 2018-12-04 – 2018-12-05 (×2): 2 mg via ORAL
  Filled 2018-12-04 (×3): qty 1

## 2018-12-04 NOTE — [facility] Group Notes (Signed)
[facility] Group Notes:  (Nursing/MHT/Case Management/Adjunct)  Date:  12/04/2018  Time:  9:45 PM  Type of Therapy:  Wrap up group  Participation Level:  Did Not Attend Jacelyn Piiles chose to stay in bed instread of coming to group.  Norm ParcelHeather V Leah Skora 12/04/2018, 9:45 PM

## 2018-12-04 NOTE — Progress Notes (Signed)
[facility] MD Progress Note  12/04/2018 9:40 AM Bobby [name]  MRN:  [id number] Subjective:    [name] continues to display more negative than positive symptoms.  With permission I did speak with his grandmother.  She elaborated that he went off to college at [employer] and did get involved with some marijuana but is been generally drug-free but his symptoms have lasted about a year.  She is concerned that he will not be compliant and we discussed long-acting injectable medications.  She is certainly on board with that and [place] and preliminary discussions has indeed agreed. He has no EPS or TD His thoughts are somewhat blocked versus poverty of content of thought consistent with his diagnosis  He has no thoughts of harming self or others when questioned directly and he denies auditory visual hallucinations and tends to deny -consistently on these evaluations- a history of hallucinations though they are likely   Principal Problem: <principal problem not specified> Diagnosis: Active Problems:   Schizophrenia, acute ([facility])  Total Time spent with patient: 20 minutes  Past Medical History:  Past Medical History:  Diagnosis Date  . Asthma    prn inhaler  . Rupture of radial collateral ligament of right thumb 11/2015    Past Surgical History:  Procedure Laterality Date  . LIGAMENT REPAIR Right 12/15/2015   Procedure: RIGHT THUMB RADIAL COLLATERAL LIGAMENT REPAIR;  Surgeon: Betha Loa, MD;  Location: [facility];  Service: Orthopedics;  Laterality: Right;   Family History: History reviewed. No pertinent family history.  Social History:  Social History   Substance and Sexual Activity  Alcohol Use No     Social History   Substance and Sexual Activity  Drug Use Not Currently  . Types: Marijuana    Social History   Socioeconomic History  . Marital status: Single    Spouse name: Not on file  . Number of children: Not on file  . Years of education: Not on file  . Highest  education level: Not on file  Occupational History  . Not on file  Social Needs  . Financial resource strain: Not on file  . Food insecurity:    Worry: Not on file    Inability: Not on file  . Transportation needs:    Medical: Not on file    Non-medical: Not on file  Tobacco Use  . Smoking status: Never Smoker  . Smokeless tobacco: Never Used  Substance and Sexual Activity  . Alcohol use: No  . Drug use: Not Currently    Types: Marijuana  . Sexual activity: Not on file  Lifestyle  . Physical activity:    Days per week: Not on file    Minutes per session: Not on file  . Stress: Not on file  Relationships  . Social connections:    Talks on phone: Not on file    Gets together: Not on file    Attends religious service: Not on file    Active member of club or organization: Not on file    Attends meetings of clubs or organizations: Not on file    Relationship status: Not on file  Other Topics Concern  . Not on file  Social History Narrative  . Not on file   Additional Social History:    Pain Medications: None Prescriptions: None.  Pt says he is not on any medication. Over the Counter: Pt says he is not on any medication. Longest period of sobriety (when/how long): denies use  Sleep: Good  Appetite:  Good  Current Medications: Current Facility-Administered Medications  Medication Dose Route Frequency Provider Last Rate Last Dose  . acetaminophen (TYLENOL) tablet 650 mg  650 mg Oral Q6H PRN Jackelyn Poling, NP      . alum & mag hydroxide-simeth (MAALOX/MYLANTA) 200-200-20 MG/5ML suspension 30 mL  30 mL Oral Q4H PRN Nira Conn A, NP      . ARIPiprazole (ABILIFY) tablet 2 mg  2 mg Oral Daily Malvin Johns, MD      . benztropine (COGENTIN) tablet 0.5 mg  0.5 mg Oral BID Malvin Johns, MD   0.5 mg at 12/04/18 1610  . haloperidol lactate (HALDOL) injection 5 mg  5 mg Intramuscular Q6H PRN Malvin Johns, MD      . hydrOXYzine (ATARAX/VISTARIL) tablet  25 mg  25 mg Oral TID PRN Jackelyn Poling, NP      . magnesium hydroxide (MILK OF MAGNESIA) suspension 30 mL  30 mL Oral Daily PRN Nira Conn A, NP      . mirtazapine (REMERON SOL-TAB) disintegrating tablet 30 mg  30 mg Oral QHS Malvin Johns, MD      . omega-3 acid ethyl esters (LOVAZA) capsule 1 g  1 g Oral BID Malvin Johns, MD   1 g at 12/04/18 0729  . prenatal multivitamin tablet 1 tablet  1 tablet Oral Q1200 Malvin Johns, MD      . risperiDONE (RISPERDAL M-TABS) disintegrating tablet 3 mg  3 mg Oral BID Malvin Johns, MD   3 mg at 12/04/18 0729  . traZODone (DESYREL) tablet 50 mg  50 mg Oral QHS PRN Jackelyn Poling, NP        Lab Results: No results found for this or any previous visit (from the past 48 hour(s)).  Blood Alcohol level:  Lab Results  Component Value Date   ETH <10 12/01/2018   ETH 108 (H) 07/12/2017    Metabolic Disorder Labs: Lab Results  Component Value Date   HGBA1C 5.5 07/18/2017   MPG 111 07/18/2017   MPG 103 07/17/2017   Lab Results  Component Value Date   PROLACTIN 51.8 (H) 07/18/2017   PROLACTIN 36.8 (H) 07/17/2017   Lab Results  Component Value Date   CHOL 161 07/18/2017   TRIG 127 07/18/2017   HDL 76 07/18/2017   CHOLHDL 2.1 07/18/2017   VLDL 25 07/18/2017   LDLCALC 60 07/18/2017   LDLCALC 69 07/17/2017    Physical Findings: AIMS: Facial and Oral Movements Muscles of Facial Expression: None, normal Lips and Perioral Area: None, normal Jaw: None, normal Tongue: None, normal,Extremity Movements Upper (arms, wrists, hands, fingers): None, normal Lower (legs, knees, ankles, toes): None, normal, Trunk Movements Neck, shoulders, hips: None, normal, Overall Severity Severity of abnormal movements (highest score from questions above): None, normal Incapacitation due to abnormal movements: None, normal Patient's awareness of abnormal movements (rate only patient's report): No Awareness, Dental Status Current problems with teeth and/or dentures?:  No Does patient usually wear dentures?: No  CIWA:    COWS:     Musculoskeletal: Strength & Muscle Tone: within normal limits Gait & Station: normal Patient leans: N/A  Psychiatric Specialty Exam: Physical Exam  ROS  Blood pressure 131/77, pulse 93, temperature 98.8 F (37.1 C), temperature source Oral, resp. rate 15, height 5\' 9"  (1.753 m), weight 87.1 kg.Body mass index is 28.35 kg/m.  General Appearance: Casual  Eye Contact:  Minimal  Speech:  Slow  Volume:  Decreased  Mood:  Dysphoric  Affect:  Blunt  Thought Process:  Descriptions of Associations: Circumstantial  Orientation:  Full (Time, Place, and Person)  Thought Content:  As best I can describe it seems to be just part of content consistent with his diagnosis  Suicidal Thoughts:  No  Homicidal Thoughts:  no  Memory:  Immediate;   Fair  Judgement:  Fair  Insight:  Fair  Psychomotor Activity:  Normal and Decreased  Concentration:  Concentration: Fair  Recall:  [employer]  Fund of Knowledge:  Fair  Language:  Fair  Akathisia:  Negative  Handed:  Right  AIMS (if indicated):     Assets:  Communication Skills Desire for Improvement  ADL's:  Intact  Cognition:  WNL  Sleep:  Number of Hours: 6.5    Test dose of aripiprazole continue risperidone continue neuro protective measures and reality based therapy may go in 1 to 2 days  Treatment Plan Summary: Daily contact with patient to assess and evaluate symptoms and progress in treatment and Medication management  Athony Coppa, MD 12/04/2018, 9:40 AM

## 2018-12-05 MED ORDER — ARIPIPRAZOLE ER 400 MG IM SRER: 400.0000 mg | INTRAMUSCULAR | 0 refills | Status: DC

## 2018-12-05 MED ORDER — PRENATAL MULTIVITAMIN CH: 1.0000 | ORAL_TABLET | Freq: Every day | ORAL | Status: DC

## 2018-12-05 MED ORDER — HYDROXYZINE HCL 25 MG PO TABS: 25.0000 mg | ORAL_TABLET | Freq: Three times a day (TID) | ORAL | 0 refills | Status: DC | PRN

## 2018-12-05 MED ORDER — MIRTAZAPINE 30 MG PO TBDP: 30.0000 mg | ORAL_TABLET | Freq: Every day | ORAL | 0 refills | Status: DC

## 2018-12-05 MED ORDER — ARIPIPRAZOLE ER 400 MG IM SRER
400.0000 mg | Freq: Once | INTRAMUSCULAR | Status: AC
  Administered 2018-12-05: 400 mg via INTRAMUSCULAR

## 2018-12-05 MED ORDER — RISPERIDONE 3 MG PO TBDP: 3.0000 mg | ORAL_TABLET | Freq: Two times a day (BID) | ORAL | 0 refills | Status: DC

## 2018-12-05 MED ORDER — OMEGA-3-ACID ETHYL ESTERS 1 G PO CAPS: 1.0000 g | ORAL_CAPSULE | Freq: Two times a day (BID) | ORAL | 0 refills | Status: DC

## 2018-12-05 MED ORDER — BENZTROPINE MESYLATE 0.5 MG PO TABS: 0.5000 mg | ORAL_TABLET | Freq: Two times a day (BID) | ORAL | 0 refills | Status: DC

## 2018-12-05 MED ORDER — TRAZODONE HCL 50 MG PO TABS: 50.0000 mg | ORAL_TABLET | Freq: Every evening | ORAL | 0 refills | Status: DC | PRN

## 2018-12-05 NOTE — Progress Notes (Signed)
Recreation Therapy Notes  Date: 12.13.19 Time: 1000 Location: 500 Hall Dayroom  Group Topic: Movie  Goal Area(s) Addresses:  Patient will identify some of the struggles of the characters of the movie. Patient will identify some of the coping skills used by the characters in the movie.  Intervention: Therapeutic Movie  Activity: Otherhood. LRT played a movie for the patients that dealt with loneliness, acceptance, depression and loss of loved ones.  Patients were to identify how the characters in the movie coped with the issues they were dealing with and how those situations shaped the characters.  Education: Discharge Planning.   Education Outcome: Acknowledges education/In group clarification offered/Needs additional education.   Clinical Observations/Feedback:  Pt did not attend group.    Lathon Adan, LRT/CTRS         Jennefer Kopp A 12/05/2018 12:00 PM 

## 2018-12-05 NOTE — Progress Notes (Addendum)
  [facility] :  Will you be returning to the same living situation after discharge:  Yes,  home  At discharge, do you have transportation home?: Yes,  grandma will pick up in afternoon Do you have the ability to pay for your medications: Yes,  insurance  Release of information consent forms completed and in the chart;  Letter for school left on chart.  Patient to Follow up at: Follow-up Information    Services, [facility]. Go on 12/08/2018.   Why:  Please attend your hospital follow up appointment on Monday, 12/08/18 at 10:00a.  Contact information: [address] [place] [id number] [phone]           Next level of care provider has [facility] Link:no  Safety Planning and Suicide Prevention discussed: Yes,  with patient, declined consent for other contacts  Have you used any form of tobacco in the last 30 days? (Cigarettes, Smokeless Tobacco, Cigars, and/or Pipes): No  Has patient been referred to the Quitline?: N/A patient is not a smoker  Patient has been referred for addiction treatment: Yes  Darreld McleanCharlotte C Tramaine Sauls, LCSWA 12/05/2018, 9:24 AM

## 2018-12-05 NOTE — Progress Notes (Signed)
Patient ID: Bobby [name], male   DOB: 09/03/1996, 22 y.o.   MRN: [id number]  Patient discharged to home/self care in the presence of his grandmother.  Patient denies SI, HI and AVH upon discharge.  Patient acknowledged the understanding of all discharge instructions and receipt of personal belongings.

## 2018-12-05 NOTE — Discharge Summary (Signed)
Physician Discharge Summary Note  Patient:  Bobby [name] is an 22 y.o., male  MRN:  [id number]  DOB:  [birth year]  Patient phone:  [phone] (home)   Patient address:   [address] [place] [address],   Total Time spent with patient: Greater than 30 minutes  Date of Admission:  12/01/2018  Date of Discharge: 12-05-18  Reason for Admission: Worsening symptoms due to medication no-compliant & reckless behaviors.  Principal Problem: Schizophrenia, acute [facility])  Discharge Diagnoses: Patient Active Problem List   Diagnosis Date Noted  . Schizophrenia, acute (HCC) [F23] 12/01/2018    Priority: High  . Undifferentiated schizophrenia (HCC) [F20.3] 07/18/2017    Priority: High  . MDD (major depressive disorder), recurrent, severe, with psychosis (HCC) [F33.3] 07/16/2017   Past Psychiatric History: Schizophrenia  Past Medical History:  Past Medical History:  Diagnosis Date  . Asthma    prn inhaler  . Rupture of radial collateral ligament of right thumb 11/2015    Past Surgical History:  Procedure Laterality Date  . LIGAMENT REPAIR Right 12/15/2015   Procedure: RIGHT THUMB RADIAL COLLATERAL LIGAMENT REPAIR;  Surgeon: Betha Loa, MD;  Location: [facility];  Service: Orthopedics;  Laterality: Right;   Family History: History reviewed. No pertinent family history.  Family Psychiatric  History: See H&P  Social History:  Social History   Substance and Sexual Activity  Alcohol Use No     Social History   Substance and Sexual Activity  Drug Use Not Currently  . Types: Marijuana    Social History   Socioeconomic History  . Marital status: Single    Spouse name: Not on file  . Number of children: Not on file  . Years of education: Not on file  . Highest education level: Not on file  Occupational History  . Not on file  Social Needs  . Financial resource strain: Not on file  . Food insecurity:    Worry: Not on file    Inability: Not on  file  . Transportation needs:    Medical: Not on file    Non-medical: Not on file  Tobacco Use  . Smoking status: Never Smoker  . Smokeless tobacco: Never Used  Substance and Sexual Activity  . Alcohol use: No  . Drug use: Not Currently    Types: Marijuana  . Sexual activity: Not on file  Lifestyle  . Physical activity:    Days per week: Not on file    Minutes per session: Not on file  . Stress: Not on file  Relationships  . Social connections:    Talks on phone: Not on file    Gets together: Not on file    Attends religious service: Not on file    Active member of club or organization: Not on file    Attends meetings of clubs or organizations: Not on file    Relationship status: Not on file  Other Topics Concern  . Not on file  Social History Narrative  . Not on file   Hospital Course: (Per Md's admission evaluation): Bobby [name] is 22 years of age she was last admitted here in July 2018 for psychotic symptoms, involving hallucinations erratic behaviors as well and was treated with a combination of Tegretol and quetiapine.  More recently he has been noncompliant with medications prompting a petition for involuntary commitment. As discussed in the HPI of the emergency department and the assessment team notes and petition the patient is been noncompliant with antipsychotic  medications, while on [facility] way to [facility] for treatment patient refused get out of the car and grandmother was advised to go ahead and take out petition papers however he been threatening her and had even refused to engage the police ran away from them when they tried to get him to mental health, further ran into traffic so he is clearly a danger to self when I attempt interview him he is mute he will look at me but make no response whatsoever. He spoke a little bit to the ER clinicians that he became mute and he tended to move his lips without speaking. He refuses to follow any simple commands on current mental status exam  he is alert presumably oriented to person he does look my direction when I speak to him but he does not speak at all again is mute throughout the entire encounter.  After the above admission assessment, Bobby [name] was re-started on the medication regimen for his presenting symptoms. He was medicated & discharged on; Abilify Maintena 400 mg Q 14 days for mood control, Cogentin 0.5 mg for EPS, Hydroxyzine 25 mg prn for anxiety & Risperdal M-Tabs 3 mg for mood control. He received his first dose of the Abilify Maintenna today 12-05-18, will be due again on 12-19-18. He was also enrolled & participated in the group counseling sessions being offered & held on this unit. He learned coping skills that should help him after discharge to cope better to maintain mood stability.  As noted above, Bobby [name]'s symptoms were stabilized this time around using two different antipsychotic medications (Abilify Maintenna & Risperdal M-Tabs). The Q 14 days Injectable antipsychotic (Abilify Maintenna) was added due to patient's hx of medication non-complaint. This is to assure steady presence of medication in his system to avoid exacerbation of his symptoms. As his treatment progressed, daily assessment notes marked improvement in his symptoms. He says he feels good. He is optimistic about the future. No residual psychotic features. Denies anxiety symptoms. The features of depression has markedly improved since coming to the hospital & re-starting on the medications. No thoughts of violence. No access to weapons. No new stressors.    Bobby [name]'s case was presented during treatment team meeting this morning. The nursing staff reports that patient has been appropriate on the unit. Patient has been interacting well with peers. No behavioral issues. Patient has not voiced any suicidal thoughts. Patient has not been observed to be internally stimulated or preoccupied. Patient has been adherent with treatment recommendations. Patient has been  tolerating his medications well.   During care review & discussion of his progress this morning at the treatment team meeting. Team members feel that patient is back to his baseline level of function. Team agrees with plan to discharge patient today to continue mental health care & medication management on an outpatient basis as noted below. He is provided with all the necessary information needed to make this appointment without any problems. In the event of worsening symptoms, patient is instructed to call the crisis hotline, 911 and or go to the nearest ED for appropriate evaluation and treatment of symptoms. He left [facility] with all personal belongings in no apparent distress.  Physical Findings: AIMS: Facial and Oral Movements Muscles of Facial Expression: None, normal Lips and Perioral Area: None, normal Jaw: None, normal Tongue: None, normal,Extremity Movements Upper (arms, wrists, hands, fingers): None, normal Lower (legs, knees, ankles, toes): None, normal, Trunk Movements Neck, shoulders, hips: None, normal, Overall Severity Severity of abnormal movements (highest score  from questions above): None, normal Incapacitation due to abnormal movements: None, normal Patient's awareness of abnormal movements (rate only patient's report): No Awareness, Dental Status Current problems with teeth and/or dentures?: No Does patient usually wear dentures?: No  CIWA:    COWS:     Musculoskeletal: Strength & Muscle Tone: within normal limits Gait & Station: normal Patient leans: N/A  Psychiatric Specialty Exam: Physical Exam  Nursing note and vitals reviewed. Constitutional: He appears well-developed.  HENT:  Head: Normocephalic.  Eyes: Pupils are equal, round, and reactive to light.  Neck: Normal range of motion.  Cardiovascular: Normal rate.  Respiratory: Effort normal.  GI: Soft.  Genitourinary:    Genitourinary Comments: Deferred   Musculoskeletal: Normal range of motion.   Neurological: He is alert.  Skin: Skin is warm.    Review of Systems  Constitutional: Negative.   Eyes: Negative.   Respiratory: Negative.  Negative for cough and shortness of breath.   Cardiovascular: Negative.  Negative for chest pain and palpitations.  Gastrointestinal: Negative.  Negative for abdominal pain, heartburn, nausea and vomiting.  Genitourinary: Negative.   Musculoskeletal: Negative.   Skin: Negative.   Neurological: Negative.  Negative for dizziness and headaches.  Endo/Heme/Allergies: Negative.     Blood pressure 131/77, pulse 93, temperature 98.8 F (37.1 C), temperature source Oral, resp. rate 15, height 5\' 9"  (1.753 m), weight 87.1 kg.Body mass index is 28.35 kg/m.  See Md's SRA    Have you used any form of tobacco in the last 30 days? (Cigarettes, Smokeless Tobacco, Cigars, and/or Pipes): No  Has this patient used any form of tobacco in the last 30 days? (Cigarettes, Smokeless Tobacco, Cigars, and/or Pipes): No  Blood Alcohol level:  Lab Results  Component Value Date   ETH <10 12/01/2018   ETH 108 (H) 07/12/2017   Metabolic Disorder Labs:  Lab Results  Component Value Date   HGBA1C 5.5 07/18/2017   MPG 111 07/18/2017   MPG 103 07/17/2017   Lab Results  Component Value Date   PROLACTIN 51.8 (H) 07/18/2017   PROLACTIN 36.8 (H) 07/17/2017   Lab Results  Component Value Date   CHOL 161 07/18/2017   TRIG 127 07/18/2017   HDL 76 07/18/2017   CHOLHDL 2.1 07/18/2017   VLDL 25 07/18/2017   LDLCALC 60 07/18/2017   LDLCALC 69 07/17/2017   See Psychiatric Specialty Exam and Suicide Risk Assessment completed by Attending Physician prior to discharge.  Discharge destination:  Home  Is patient on multiple antipsychotic therapies at discharge:  Yes,  (Abilify Maintenna & Risperdal). Do you recommend tapering to monotherapy for antipsychotics?  Yes   Has Patient had three or more failed trials of antipsychotic monotherapy by history:  No, chart review  showed only Seroquel in the past.  Recommended Plan for Multiple Antipsychotic Therapies: Taper to monotherapy as described:  Per outpatient provider when his symptoms stabilize.  Allergies as of 12/05/2018   No Known Allergies     Medication List    STOP taking these medications   BC HEADACHE 325-95-16 MG Tabs Generic drug:  Aspirin-Salicylamide-Caffeine     TAKE these medications     Indication  ARIPiprazole ER 400 MG Srer injection Commonly known as:  ABILIFY MAINTENA Inject 2 mLs (400 mg total) into the muscle every 14 (fourteen) days. (Due on 12-19-18): For mood control Start taking on:  December 19, 2018  Indication:  Mood control   benztropine 0.5 MG tablet Commonly known as:  COGENTIN Take 1 tablet (0.5  mg total) by mouth 2 (two) times daily. For prevention of drug induced tremors  Indication:  Extrapyramidal Reaction caused by Medications   hydrOXYzine 25 MG tablet Commonly known as:  ATARAX/VISTARIL Take 1 tablet (25 mg total) by mouth 3 (three) times daily as needed for anxiety.  Indication:  Feeling Anxious   mirtazapine 30 MG disintegrating tablet Commonly known as:  REMERON SOL-TAB Take 1 tablet (30 mg total) by mouth at bedtime. For depression  Indication:  Major Depressive Disorder   omega-3 acid ethyl esters 1 g capsule Commonly known as:  LOVAZA Take 1 capsule (1 g total) by mouth 2 (two) times daily. For high cholesterol  Indication:  High Amount of Triglycerides in the Blood   prenatal multivitamin Tabs tablet Take 1 tablet by mouth daily at 12 noon. (May buy from over the counter): Vitamin supplement  Indication:  Vitamin Deficiency   risperidone 3 MG disintegrating tablet Commonly known as:  RISPERDAL M-TABS Take 1 tablet (3 mg total) by mouth 2 (two) times daily. For mood control  Indication:  Mood control   traZODone 50 MG tablet Commonly known as:  DESYREL Take 1 tablet (50 mg total) by mouth at bedtime as needed for sleep.  Indication:   Trouble Sleeping      Follow-up Information    Services, [facility]. Go on 12/08/2018.   Why:  Please attend your hospital follow up appointment on Monday, 12/08/18 at 10:00a.  Contact information: [address] [place] [id number] [phone]          Follow-up recommendations: Activity:  As tolerated Diet: As recommended by your primary care doctor. Keep all scheduled follow-up appointments as recommended.    Comments: Patient is instructed prior to discharge to: Take all medications as prescribed by his/her mental healthcare provider. Report any adverse effects and or reactions from the medicines to his/her outpatient provider promptly. Patient has been instructed & cautioned: To not engage in alcohol and or illegal drug use while on prescription medicines. In the event of worsening symptoms, patient is instructed to call the crisis hotline, 911 and or go to the nearest ED for appropriate evaluation and treatment of symptoms. To follow-up with his/her primary care provider for your other medical issues, concerns and or health care needs.   Signed: Armandina StammerAgnes Amadou Katzenstein, NP, PMHNP, FNP-BC 12/05/2018, 10:58 AM

## 2018-12-05 NOTE — Progress Notes (Signed)
Nursing Progress Note: 7p-7a D: Pt currently presents with a isolative/minimal/limited interactions affect and behavior. Not Interacting with the milieu. Pt reports poor sleep during the previous night with current medication regimen. Pt did not attend wrap-up group.  A: Pt refused medications per providers orders. Pt's labs and vitals were monitored throughout the night. Pt supported emotionally and encouraged to express concerns and questions. Pt educated on medications.  R: Pt's safety ensured with 15 minute and environmental checks. Pt currently denies SI, HI, and VH and endorses AH. Pt verbally contracts to seek staff if SI,HI, or AVH occurs and to consult with staff before acting on any harmful thoughts. Will continue to monitor.

## 2018-12-05 NOTE — Progress Notes (Signed)
Recreation Therapy Notes  INPATIENT RECREATION TR PLAN  Patient Details Name: Bobby [name] MRN: [id number] DOB: 1996-10-30 Today's Date: 12/05/2018  Rec Therapy Plan Is patient appropriate for Therapeutic Recreation?: Yes Treatment times per week: about 3 days  Estimated Length of Stay: 5-7 days TR Treatment/Interventions: Group participation (Comment)  Discharge Criteria Pt will be discharged from therapy if:: Discharged Treatment plan/goals/alternatives discussed and agreed upon by:: Patient/family  Discharge Summary Short term goals set: See patient care plan Short term goals met: Not met Reason goals not met: Pt did not attend group. Therapeutic equipment acquired: N/A Reason patient discharged from therapy: Discharge from hospital Pt/family agrees with progress & goals achieved: Yes Date patient discharged from therapy: 12/05/18    Victorino Sparrow, LRT/CTRS  Ria Comment, Janel Beane A 12/05/2018, 12:05 PM

## 2018-12-05 NOTE — [facility] Suicide Risk Assessment (Signed)
[facility]   Principal Problem:  Discharge Diagnoses: Active Problems:   Schizophrenia, acute (HCC) On mental status exam patient is alert oriented to person place time situation affect is a little constricted but he denies wanting to harm self or others and denies positive symptoms seems to have mostly negative symptoms of schizophrenia minimal suicide risk  Total Time spent with patient: 45 minutes Mental Status Per Nursing Assessment::   On Admission:  Suicidal ideation indicated by others  Demographic Factors:  Male  Loss Factors: Decrease in vocational status  Historical Factors: NA  Risk Reduction Factors:   Religious beliefs about death  Continued Clinical Symptoms:  Schizophrenia:   Paranoid or undifferentiated type  Cognitive Features That Contribute To Risk:  None    Suicide Risk:  Minimal: No identifiable suicidal ideation.  Patients presenting with no risk factors but with morbid ruminations; may be classified as minimal risk based on the severity of the depressive symptoms    Plan Of Care/Follow-up recommendations:  Activity:  full  Jeff Mccallum, MD 12/05/2018, 8:55 AM

## 2018-12-05 NOTE — Plan of Care (Signed)
Pt did not attend recreation therapy group sessions.   Fredonia Casalino, LRT/CTRS 

## 2018-12-05 NOTE — [facility] Group Notes (Signed)
[facility]  12[facility]2018   Type of Therapy:  Group Therapy  Participation Level:  Active  Participation Quality:  Minimal  Affect:  Appropriate  Cognitive:  Disoriented  Insight:  Limited  Engagement in Therapy:  Limited  Modes of Intervention:  Discussion and Support  Summary of Progress/Problems: Patient attended a spiritual care group with Chaplain regarding the meaning of and importance of "hope" in their life. Patient defined hope as "it is powerful, it can change lives. It can keep you out of trouble." Patient did not respond to some direct questions and stared.   Darreld McleanCharlotte C Belma Dyches 12/05/2018, 3:23 PM

## 2019-08-03 ENCOUNTER — Encounter (INDEPENDENT_AMBULATORY_CARE_PROVIDER_SITE_OTHER): Payer: Self-pay

## 2019-08-03 ENCOUNTER — Encounter: Payer: Self-pay | Admitting: Family Medicine

## 2019-08-03 ENCOUNTER — Other Ambulatory Visit: Payer: Self-pay

## 2019-08-03 ENCOUNTER — Ambulatory Visit (INDEPENDENT_AMBULATORY_CARE_PROVIDER_SITE_OTHER): Payer: 59 | Admitting: Family Medicine

## 2019-08-03 VITALS — BP 125/86 | HR 70 | Temp 98.9°F | Ht 69.0 in | Wt 217.2 lb

## 2019-08-03 DIAGNOSIS — J4520 Mild intermittent asthma, uncomplicated: Secondary | ICD-10-CM | POA: Diagnosis not present

## 2019-08-03 MED ORDER — ALBUTEROL SULFATE HFA 108 (90 BASE) MCG/ACT IN AERS: 2.0000 | INHALATION_SPRAY | Freq: Four times a day (QID) | RESPIRATORY_TRACT | 5 refills | Status: DC | PRN

## 2019-08-03 NOTE — Patient Instructions (Signed)
Start using albuterol for cough or short of breath Keep appointment at [facility] for follow up-mental health
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
## 2019-09-24 ENCOUNTER — Telehealth: Payer: Self-pay

## 2019-09-24 ENCOUNTER — Other Ambulatory Visit (HOSPITAL_COMMUNITY)
Admission: RE | Admit: 2019-09-24 | Discharge: 2019-09-24 | Disposition: A | Discharge status: Home / Self Care | Payer: 59 | Source: Ambulatory Visit | Attending: Family Medicine | Admitting: Family Medicine

## 2019-09-24 ENCOUNTER — Other Ambulatory Visit: Payer: Self-pay

## 2019-09-24 ENCOUNTER — Ambulatory Visit (INDEPENDENT_AMBULATORY_CARE_PROVIDER_SITE_OTHER): Payer: 59 | Admitting: Family Medicine

## 2019-09-24 VITALS — BP 125/82 | HR 66 | Temp 98.6°F | Ht 69.0 in | Wt 210.6 lb

## 2019-09-24 DIAGNOSIS — J029 Acute pharyngitis, unspecified: Secondary | ICD-10-CM

## 2019-09-24 DIAGNOSIS — Z202 Contact with and (suspected) exposure to infections with a predominantly sexual mode of transmission: Secondary | ICD-10-CM

## 2019-09-24 NOTE — Telephone Encounter (Signed)
LeighAnn Booker Bhatnagar, CMA  

## 2019-09-24 NOTE — Progress Notes (Signed)
Established Patient Office Visit  Subjective:  Patient ID: Bobby [name], male    DOB: [birth year]  Age: 23 y.o. MRN: [id number]  CC:  Chief Complaint  Patient presents with  . Sore Throat    times 1 day    HPI Bobby [name] presents for concern forSTD risk due tosexual contact with girl friend-started her period while having sex. No h/o of HIV-pt with concern for HIV due to blood exposure. Pt with h/o STD in the past-gonorrhea in the past. No penile discharge. Sore throat onset yesterday-oral sex as part of sexual practice  Past Medical History:  Diagnosis Date  . Asthma    prn inhaler  . Rupture of radial collateral ligament of right thumb 11/2015    Past Surgical History:  Procedure Laterality Date  . LIGAMENT REPAIR Right 12/15/2015   Procedure: RIGHT THUMB RADIAL COLLATERAL LIGAMENT REPAIR;  Surgeon: Betha Loa, MD;  Location: [facility];  Service: Orthopedics;  Laterality: Right;    No family history on file.  Social History   Socioeconomic History  . Marital status: Single    Spouse name: Not on file  . Number of children: Not on file  . Years of education: Not on file  . Highest education level: Not on file  Occupational History  . Not on file  Social Needs  . Financial resource strain: Not on file  . Food insecurity    Worry: Not on file    Inability: Not on file  . Transportation needs    Medical: Not on file    Non-medical: Not on file  Tobacco Use  . Smoking status: Never Smoker  . Smokeless tobacco: Never Used  Substance and Sexual Activity  . Alcohol use: No  . Drug use: Not Currently    Types: Marijuana  . Sexual activity: Not on file  Lifestyle  . Physical activity    Days per week: Not on file    Minutes per session: Not on file  . Stress: Not on file  Relationships  . Social Musician on phone: Not on file    Gets together: Not on file    Attends religious service: Not on file    Active member of club or  organization: Not on file    Attends meetings of clubs or organizations: Not on file    Relationship status: Not on file  . Intimate partner violence    Fear of current or ex partner: Not on file    Emotionally abused: Not on file    Physically abused: Not on file    Forced sexual activity: Not on file  Other Topics Concern  . Not on file  Social History Narrative  . Not on file    Outpatient Medications Prior to Visit  Medication Sig Dispense Refill  . albuterol (VENTOLIN HFA) 108 (90 Base) MCG/ACT inhaler Inhale 2 puffs into the lungs every 6 (six) hours as needed for wheezing or shortness of breath. 18 g 5  . ARIPiprazole ER (ABILIFY MAINTENA) 400 MG SRER injection Inject 2 mLs (400 mg total) into the muscle every 14 (fourteen) days. (Due on 12-19-18): For mood control (Patient not taking: Reported on 08/03/2019) 2 mL 0  . benztropine (COGENTIN) 0.5 MG tablet Take 1 tablet (0.5 mg total) by mouth 2 (two) times daily. For prevention of drug induced tremors 60 tablet 0  . Cholecalciferol (VITAMIN D-3) 125 MCG (5000 UT) TABS Take 125 mcg by mouth  daily.    . hydrOXYzine (ATARAX/VISTARIL) 25 MG tablet Take 1 tablet (25 mg total) by mouth 3 (three) times daily as needed for anxiety. 60 tablet 0  . omega-3 acid ethyl esters (LOVAZA) 1 g capsule Take 1 capsule (1 g total) by mouth 2 (two) times daily. For high cholesterol 60 capsule 0  . risperiDONE (RISPERDAL M-TABS) 3 MG disintegrating tablet Take 1 tablet (3 mg total) by mouth 2 (two) times daily. For mood control 60 tablet 0  . traZODone (DESYREL) 50 MG tablet Take 1 tablet (50 mg total) by mouth at bedtime as needed for sleep. 30 tablet 0   No facility-administered medications prior to visit.     ROS Review of Systems  HENT:       Sore throat-oral sex with girl friend  Genitourinary:       No penile discharge No masses or lesions      Objective:    Physical Exam  Constitutional: He is oriented to person, place, and time. He  appears well-developed and well-nourished.  HENT:  Head: Normocephalic and atraumatic.  Right Ear: External ear normal.  Left Ear: External ear normal.  Nose: Nose normal.  Mouth/Throat: Oropharynx is clear and moist.  Eyes: Conjunctivae are normal.  Neck: Normal range of motion. Neck supple.  Cardiovascular: Normal rate and regular rhythm.  Pulmonary/Chest: Effort normal.  Neurological: He is alert and oriented to person, place, and time.  Psychiatric: He has a normal mood and affect.    BP 125/82 (BP Location: Left Arm, Patient Position: Sitting, Cuff Size: Normal)   Pulse 66   Temp 98.6 F (37 C) (Oral)   Ht 5\' 9"  (1.753 m)   Wt 210 lb 9.6 oz (95.5 kg)   SpO2 96%   BMI 31.10 kg/m  Wt Readings from Last 3 Encounters:  09/24/19 210 lb 9.6 oz (95.5 kg)  08/03/19 217 lb 3.2 oz (98.5 kg)  12/01/18 192 lb (87.1 kg)     Health Maintenance Due  Topic Date Due  . HIV Screening  01/10/2013  . TETANUS/TDAP  01/10/2017  . INFLUENZA VACCINE  07/25/2019    There are no preventive care reminders to display for this patient.  Lab Results  Component Value Date   TSH 1.333 07/17/2017   Lab Results  Component Value Date   WBC 9.7 12/01/2018   HGB 15.4 12/01/2018   HCT 47.8 12/01/2018   MCV 87.7 12/01/2018   PLT 357 12/01/2018   Lab Results  Component Value Date   NA 136 12/01/2018   K 4.0 12/01/2018   CO2 22 12/01/2018   GLUCOSE 105 (H) 12/01/2018   BUN 13 12/01/2018   CREATININE 1.14 12/01/2018   BILITOT 0.8 12/01/2018   ALKPHOS 90 12/01/2018   AST 30 12/01/2018   ALT 30 12/01/2018   PROT 8.4 (H) 12/01/2018   ALBUMIN 4.5 12/01/2018   CALCIUM 9.5 12/01/2018   ANIONGAP 9 12/01/2018   Lab Results  Component Value Date   CHOL 161 07/18/2017   Lab Results  Component Value Date   HDL 76 07/18/2017   Lab Results  Component Value Date   LDLCALC 60 07/18/2017   Lab Results  Component Value Date   TRIG 127 07/18/2017   Lab Results  Component Value Date    CHOLHDL 2.1 07/18/2017   Lab Results  Component Value Date   HGBA1C 5.5 07/18/2017      Assessment & Plan:   1. Pharyngitis, unspecified etiology-afebrile - POCT rapid strep  A=negative GC/Ch-throat swab -sent to pharmacy  2. Possible exposure to STD HIV/RPR/Hep panel-pending GC/CH-urine-no current symptoms-no meds given-will wait for results since asymptomatic - HIV Antibody (routine testing w rflx) - RPR - Hepatitis panel, acute - GC/Chlamydia probe amp ([place])not at [facility] - GC/Chlamydia Probe Amp - GC/Chlamydia Probe Amp([facility]) - GC/CT Probe, Amp (Throat)  Follow-up: \prn   Bennette Hasty Hannah Beat, MD

## 2019-09-24 NOTE — Patient Instructions (Addendum)
Avoid drinking/eating after others Throat spray or lozenges Lab work at [employer] notify on results of labwork and cultures   Pharyngitis  Pharyngitis is a sore throat (pharynx). This is when there is redness, pain, and swelling in your throat. Most of the time, this condition gets better on its own. In some cases, you may need medicine. Follow these instructions at home:  Take over-the-counter and prescription medicines only as told by your doctor. ? If you were prescribed an antibiotic medicine, take it as told by your doctor. Do not stop taking the antibiotic even if you start to feel better. ? Do not give children aspirin. Aspirin has been linked to Reye syndrome.  Drink enough water and fluids to keep your pee (urine) clear or pale yellow.  Get a lot of rest.  Rinse your mouth (gargle) with a salt-water mixture 3-4 times a day or as needed. To make a salt-water mixture, completely dissolve -1 tsp of salt in 1 cup of warm water.  If your doctor approves, you may use throat lozenges or sprays to soothe your throat. Contact a doctor if:  You have large, tender lumps in your neck.  You have a rash.  You cough up green, yellow-brown, or bloody spit. Get help right away if:  You have a stiff neck.  You drool or cannot swallow liquids.  You cannot drink or take medicines without throwing up.  You have very bad pain that does not go away with medicine.  You have problems breathing, and it is not from a stuffy nose.  You have new pain and swelling in your knees, ankles, wrists, or elbows. Summary  Pharyngitis is a sore throat (pharynx). This is when there is redness, pain, and swelling in your throat.  If you were prescribed an antibiotic medicine, take it as told by your doctor. Do not stop taking the antibiotic even if you start to feel better.  Most of the time, pharyngitis gets better on its own. Sometimes, you may need medicine. This information is not intended to  replace advice given to you by your health care provider. Make sure you discuss any questions you have with your health care provider. Document Released: 05/28/2008 Document Revised: 11/22/2017 Document Reviewed: 01/15/2017 Elsevier Patient Education  2020 [employer].

## 2019-09-24 NOTE — Telephone Encounter (Signed)
[place]

## 2019-09-25 LAB — CYTOLOGY, (ORAL, ANAL, URETHRAL) ANCILLARY ONLY
Chlamydia: NEGATIVE
Neisseria Gonorrhea: NEGATIVE

## 2019-09-27 DIAGNOSIS — J029 Acute pharyngitis, unspecified: Secondary | ICD-10-CM | POA: Insufficient documentation

## 2019-09-27 DIAGNOSIS — Z202 Contact with and (suspected) exposure to infections with a predominantly sexual mode of transmission: Secondary | ICD-10-CM | POA: Insufficient documentation

## 2019-09-28 ENCOUNTER — Telehealth: Payer: Self-pay | Admitting: Family Medicine

## 2019-09-28 NOTE — Telephone Encounter (Signed)
Negative, so far. Did pt complete blood work at lab?

## 2019-09-28 NOTE — Telephone Encounter (Signed)
Patient would like test results from last week.

## 2019-09-28 NOTE — Telephone Encounter (Signed)
Routing to Dr Corum for Results? 

## 2019-09-29 NOTE — Telephone Encounter (Signed)
No labs were ordered...

## 2020-02-01 ENCOUNTER — Other Ambulatory Visit: Payer: Self-pay | Admitting: Family Medicine

## 2020-02-01 ENCOUNTER — Telehealth: Payer: Self-pay | Admitting: Family Medicine

## 2020-02-01 DIAGNOSIS — F203 Undifferentiated schizophrenia: Secondary | ICD-10-CM

## 2020-02-01 NOTE — Telephone Encounter (Signed)
Patient is requesting referral to psychiatrist

## 2020-02-01 NOTE — Telephone Encounter (Signed)
Routing to Dr. Corum for advice ? 

## 2020-03-17 ENCOUNTER — Ambulatory Visit: Payer: Medicaid Other | Attending: Internal Medicine

## 2020-03-17 DIAGNOSIS — Z23 Encounter for immunization: Secondary | ICD-10-CM

## 2020-03-17 NOTE — Progress Notes (Signed)
   Covid-19 Vaccination Clinic  Name:  Bobby [name]    MRN: [id number] DOB: 12/11/1996  03/17/2020  Mr. Bobby [name] was observed post Covid-19 immunization for 15 minutes without incident. He was provided with Vaccine Information Sheet and instruction to access the V-Safe system.   Mr. Bobby [name] was instructed to call 911 with any severe reactions post vaccine: Marland Kitchen Difficulty breathing  . Swelling of face and throat  . A fast heartbeat  . A bad rash all over body  . Dizziness and weakness   Immunizations Administered    Name Date Dose VIS Date Route   Moderna COVID-19 Vaccine 03/17/2020  9:46 AM 0.5 mL 11/24/2019 Intramuscular   Manufacturer: Moderna   Lot: [id number]   NDC: 94801-655-37

## 2020-04-19 ENCOUNTER — Ambulatory Visit: Payer: Medicaid Other | Attending: Internal Medicine

## 2020-04-19 DIAGNOSIS — Z23 Encounter for immunization: Secondary | ICD-10-CM

## 2020-04-19 NOTE — Progress Notes (Signed)
   Covid-19 Vaccination Clinic  Name:  Bobby [name]    MRN: [id number] DOB: 06-13-1996  04/19/2020  [name] was observed post Covid-19 immunization for 15 minutes without incident. He was provided with Vaccine Information Sheet and instruction to access the V-Safe system.   [name] was instructed to call 911 with any severe reactions post vaccine: Marland Kitchen Difficulty breathing  . Swelling of face and throat  . A fast heartbeat  . A bad rash all over body  . Dizziness and weakness   Immunizations Administered    Name Date Dose VIS Date Route   Moderna COVID-19 Vaccine 04/19/2020  8:24 AM 0.5 mL 11/2019 Intramuscular   Manufacturer: Moderna   Lot: [id number]   NDC: 15947-076-15

## 2020-08-13 ENCOUNTER — Emergency Department (HOSPITAL_COMMUNITY): Payer: No Typology Code available for payment source

## 2020-08-13 ENCOUNTER — Encounter (HOSPITAL_COMMUNITY): Payer: Self-pay | Admitting: *Deleted

## 2020-08-13 ENCOUNTER — Other Ambulatory Visit: Payer: Self-pay

## 2020-08-13 ENCOUNTER — Emergency Department (HOSPITAL_COMMUNITY)
Admission: EM | Admit: 2020-08-13 | Discharge: 2020-08-13 | Disposition: A | Discharge status: Home / Self Care | Payer: No Typology Code available for payment source | Attending: Emergency Medicine | Admitting: Emergency Medicine

## 2020-08-13 DIAGNOSIS — Y999 Unspecified external cause status: Secondary | ICD-10-CM | POA: Insufficient documentation

## 2020-08-13 DIAGNOSIS — Z23 Encounter for immunization: Secondary | ICD-10-CM | POA: Insufficient documentation

## 2020-08-13 DIAGNOSIS — S91331A Puncture wound without foreign body, right foot, initial encounter: Secondary | ICD-10-CM | POA: Diagnosis not present

## 2020-08-13 DIAGNOSIS — Y939 Activity, unspecified: Secondary | ICD-10-CM | POA: Insufficient documentation

## 2020-08-13 DIAGNOSIS — Z79899 Other long term (current) drug therapy: Secondary | ICD-10-CM | POA: Insufficient documentation

## 2020-08-13 DIAGNOSIS — W450XXA Nail entering through skin, initial encounter: Secondary | ICD-10-CM | POA: Insufficient documentation

## 2020-08-13 DIAGNOSIS — Y929 Unspecified place or not applicable: Secondary | ICD-10-CM | POA: Diagnosis not present

## 2020-08-13 MED ORDER — HYDROCODONE-ACETAMINOPHEN 5-325 MG PO TABS
1.0000 | ORAL_TABLET | Freq: Once | ORAL | Status: AC
  Administered 2020-08-13: 1 via ORAL
  Filled 2020-08-13: qty 1

## 2020-08-13 MED ORDER — TETANUS-DIPHTH-ACELL PERTUSSIS 5-2.5-18.5 LF-MCG/0.5 IM SUSP
0.5000 mL | Freq: Once | INTRAMUSCULAR | Status: AC
  Administered 2020-08-13: 0.5 mL via INTRAMUSCULAR
  Filled 2020-08-13: qty 0.5

## 2020-08-13 MED ORDER — CIPROFLOXACIN HCL 500 MG PO TABS: 500.0000 mg | ORAL_TABLET | Freq: Two times a day (BID) | ORAL | 0 refills | Status: DC

## 2020-08-13 MED ORDER — BACITRACIN ZINC 500 UNIT/GM EX OINT
TOPICAL_OINTMENT | Freq: Once | CUTANEOUS | Status: AC
  Administered 2020-08-13: 2 via TOPICAL
  Filled 2020-08-13: qty 1.8

## 2020-08-13 MED ORDER — CIPROFLOXACIN HCL 250 MG PO TABS
500.0000 mg | ORAL_TABLET | Freq: Once | ORAL | Status: AC
  Administered 2020-08-13: 500 mg via ORAL
  Filled 2020-08-13: qty 2

## 2020-08-13 NOTE — ED Triage Notes (Signed)
Pt states that he stepped on three nails about 45 minutes ago,

## 2020-08-13 NOTE — ED Provider Notes (Signed)
[facility] EMERGENCY DEPARTMENT Provider Note   CSN: [id number] Arrival date & time: 08/13/20  1437     History Chief Complaint  Patient presents with  . Foot Pain    Bobby [name] is a 24 y.o. male who presents for evaluation of puncture wound noted to his right foot that occurred about an hour prior to ED arrival.  Patient reports that he was doing work around the house and states that he stepped on 3 nails.  He had shoes on at that time.  He states that it is hurt when he is trying to ambulate or bear weight on it.  He does not know when his last tetanus shot was.  He denies any numbness/weakness.  The history is provided by the patient.       Past Medical History:  Diagnosis Date  . Asthma    prn inhaler  . Rupture of radial collateral ligament of right thumb 11/2015    Patient Active Problem List   Diagnosis Date Noted  . Pharyngitis 09/27/2019  . Possible exposure to STD 09/27/2019  . Schizophrenia, acute (HCC) 12/01/2018  . 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 LIGAMENT REPAIR Right 12/15/2015   Procedure: RIGHT THUMB RADIAL COLLATERAL LIGAMENT REPAIR;  Surgeon: Betha Loa, MD;  Location: [facility];  Service: Orthopedics;  Laterality: Right;       No family history on file.  Social History   Tobacco Use  . Smoking status: Never Smoker  . Smokeless tobacco: Never Used  Vaping Use  . Vaping Use: Never used  Substance Use Topics  . Alcohol use: No  . Drug use: Not Currently    Types: Marijuana    Home Medications Prior to Admission medications   Medication Sig Start Date End Date Taking? Authorizing Provider  albuterol (VENTOLIN HFA) 108 (90 Base) MCG/ACT inhaler Inhale 2 puffs into the lungs every 6 (six) hours as needed for wheezing or shortness of breath. 08/03/19   Corum, Minerva Fester, MD  ARIPiprazole ER (ABILIFY MAINTENA) 400 MG SRER injection Inject 2 mLs (400 mg total) into the muscle every 14 (fourteen) days. (Due on 12-19-18): For mood control Patient not taking: Reported on 08/03/2019 12/19/18   Armandina Stammer I, NP  benztropine (COGENTIN) 0.5 MG tablet Take 1 tablet (0.5 mg total) by mouth 2 (two) times daily. For prevention of drug induced tremors 12/05/18   Armandina Stammer I, NP  Cholecalciferol (VITAMIN D-3) 125 MCG (5000 UT) TABS Take 125 mcg by mouth daily.    [provider]  ciprofloxacin (CIPRO) 500 MG tablet Take 1 tablet (500 mg total) by mouth 2 (two) times daily. 08/13/20   Maxwell Caul, PA-C  hydrOXYzine (ATARAX/VISTARIL) 25 MG tablet Take 1 tablet (25 mg total) by mouth 3 (three) times daily as needed for anxiety. 12/05/18   Armandina Stammer I, NP  omega-3 acid ethyl esters (LOVAZA) 1 g capsule Take 1 capsule (1 g total) by mouth 2 (two) times daily. For high cholesterol 12/05/18   Armandina Stammer I, NP  risperiDONE (RISPERDAL M-TABS) 3 MG disintegrating tablet Take 1 tablet (3 mg total) by mouth 2 (two) times daily. For mood control 12/05/18   Armandina Stammer I, NP  traZODone (DESYREL) 50 MG tablet Take 1 tablet (50 mg total) by mouth at bedtime as needed for sleep. 12/05/18   Armandina Stammer  I, NP    Allergies    Patient has no known allergies.  Review of Systems   Review of Systems  Skin: Positive for wound.  Neurological: Negative for weakness and numbness.  All other systems reviewed and are negative.   Physical Exam Updated Vital Signs BP 133/71 (BP Location: Left Arm)   Pulse 75   Temp 98.2 F (36.8 C) (Oral)   Resp 18   Ht 5\' 9"  (1.753 m)   Wt 107 kg   SpO2 98%   BMI 34.85 kg/m   Physical Exam Vitals and nursing note reviewed.  Constitutional:      Appearance: He is well-developed.  HENT:     Head: Normocephalic and atraumatic.  Eyes:     General: No scleral icterus.       Right eye: No discharge.        Left eye: No discharge.      Conjunctiva/sclera: Conjunctivae normal.  Cardiovascular:     Pulses:          Dorsalis pedis pulses are 2+ on the right side and 2+ on the left side.  Pulmonary:     Effort: Pulmonary effort is normal.  Musculoskeletal:     Comments: Tenderness palpation of the plantar surface of the right foot.  No deformity or crepitus noted.  He can wiggle all 5 toes with any difficulty.  Skin:    General: Skin is warm and dry.     Capillary Refill: Capillary refill takes less than 2 seconds.     Comments: 3 small puncture wounds noted to the plantar surface of the midfoot.  No foreign body noted.  No surrounding drainage, warmth, erythema. Good distal cap refill.  RLE is not dusky in appearance or cool to touch.  Neurological:     Mental Status: He is alert.  Psychiatric:        Speech: Speech normal.        Behavior: Behavior normal.     ED Results / Procedures / Treatments   Labs (all labs ordered are listed, but only abnormal results are displayed) Labs Reviewed - No data to display  EKG None  Radiology DG Foot Complete Right  Result Date: 08/13/2020 CLINICAL DATA:  Stepped on 3 nails today. EXAM: RIGHT FOOT COMPLETE - 3+ VIEW COMPARISON:  None. FINDINGS: There is no evidence of fracture or dislocation. There is no evidence of arthropathy or other focal bone abnormality. Mild soft tissue swelling is seen along the plantar aspect of the distal right foot. No radiopaque soft tissue foreign bodies are identified. IMPRESSION: No acute osseous abnormality. Electronically Signed   By: 08/15/2020 M.D.   On: 08/13/2020 15:47    Procedures Irrigation  Date/Time: 08/13/2020 4:01 PM Performed by: 08/15/2020, PA-C Authorized by: Maxwell Caul, PA-C  Consent: Verbal consent obtained. Risks and benefits: risks, benefits and alternatives were discussed Consent given by: patient Patient understanding: patient states understanding of the procedure being performed Patient  consent: the patient's understanding of the procedure matches consent given Procedure consent: procedure consent matches procedure scheduled Relevant documents: relevant documents present and verified Test results: test results available and properly labeled Site marked: the operative site was marked Imaging studies: imaging studies available Required items: required blood products, implants, devices, and special equipment available Patient identity confirmed: verbally with patient Patient tolerance: patient tolerated the procedure well with no immediate complications Comments: Patient's right foot with 3 puncture wounds.  All were pinpoint in size.  They did not extend deep.  No foreign bodies noted.  The area was thoroughly simply irrigated with over a liter of sterile saline.    (including critical care time)  Medications Ordered in ED Medications  Tdap (BOOSTRIX) injection 0.5 mL (has no administration in time range)  ciprofloxacin (CIPRO) tablet 500 mg (has no administration in time range)  HYDROcodone-acetaminophen (NORCO/VICODIN) 5-325 MG per tablet 1 tablet (has no administration in time range)  bacitracin ointment (has no administration in time range)    ED Course  I have reviewed the triage vital signs and the nursing notes.  Pertinent labs & imaging results that were available during my care of the patient were reviewed by me and considered in my medical decision making (see chart for details).    MDM Rules/Calculators/A&P                          24 year old male who presents for evaluation of injury to right foot that occurred an hour prior to ED arrival.  He reports that he was at home when he stepped on 3 nails.  He was wearing shoes at the time.  Unsure when last tetanus shot was.  On initial arrival, he is afebrile nontoxic-appearing.  Vital signs are stable.  On exam, he has 3 small puncture wounds on the plantar surface of the foot.  They are pinpoint in size.  No  foreign bodies noted.  X-rays ordered at triage. Plan for wound care, update tetanus.  X-ray reviewed.  Negative for any acute bony abnormality.  Irrigation as documented above.  Plan for tetanus, antibiotics (cipro for pseudomonas coverage). At this time, patient exhibits no emergent life-threatening condition that require further evaluation in ED or admission. Patient had ample opportunity for questions and discussion. All patient's questions were answered with full understanding. Strict return precautions discussed. Patient expresses understanding and agreement to plan.   Portions of this note were generated with Scientist, clinical (histocompatibility and immunogenetics). Dictation errors may occur despite best attempts at proofreading.   Final Clinical Impression(s) / ED Diagnoses Final diagnoses:  Puncture wound of right foot, initial encounter    Rx / DC Orders ED Discharge Orders         Ordered    ciprofloxacin (CIPRO) 500 MG tablet  2 times daily        08/13/20 1607           Rosana Hoes 08/13/20 1615    Bethann Berkshire, MD 08/13/20 2204

## 2020-08-13 NOTE — Discharge Instructions (Signed)
Gently clean the wound with soap and water. Make sure to pat dry the wound before covering it with any dressing. You can use topical antibiotic ointment and bandage. Ice and elevate for pain relief.   You can take Tylenol or Ibuprofen as directed for pain. You can alternate Tylenol and Ibuprofen every 4 hours for additional pain relief.   Take antibiotics as directed. Please take all of your antibiotics until finished.  Monitor closely for any signs of infection. Return to the Emergency Department for any worsening redness/swelling of the area that begins to spread, drainage from the site, worsening pain, fever or any other worsening or concerning symptoms.   

## 2020-09-16 IMAGING — DX DG FOOT COMPLETE 3+V*R*
3 series · 3 of 3 positions shown · non-contrast
Comparison: None.

CLINICAL DATA: Stepped on 3 nails today.

EXAM:
RIGHT FOOT COMPLETE - 3+ VIEW

[foot ap]
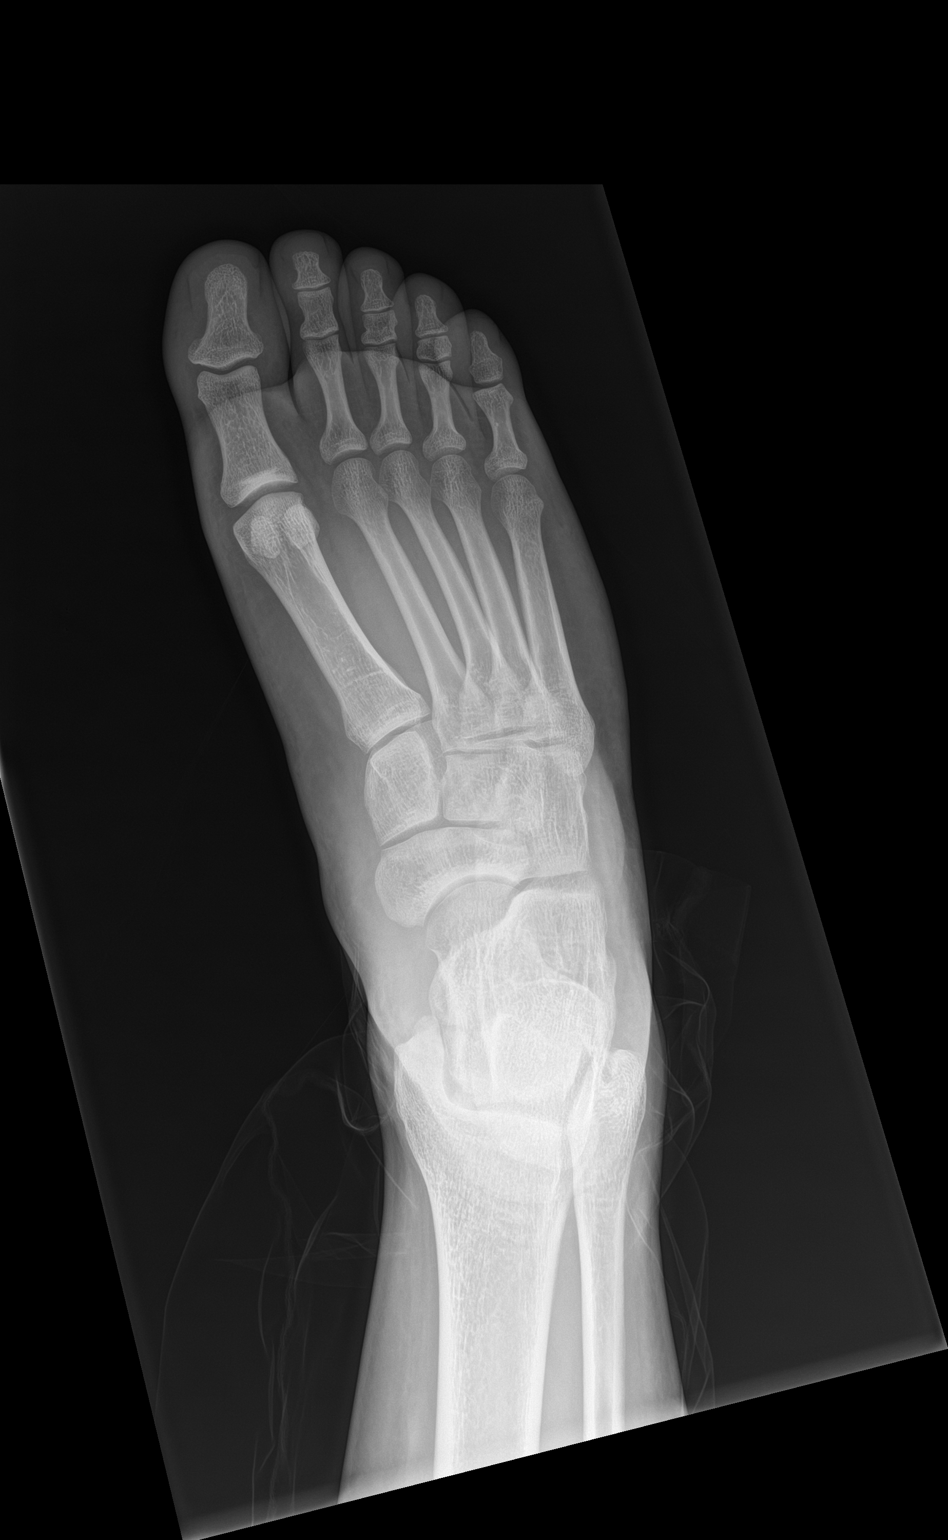

[foot obl]
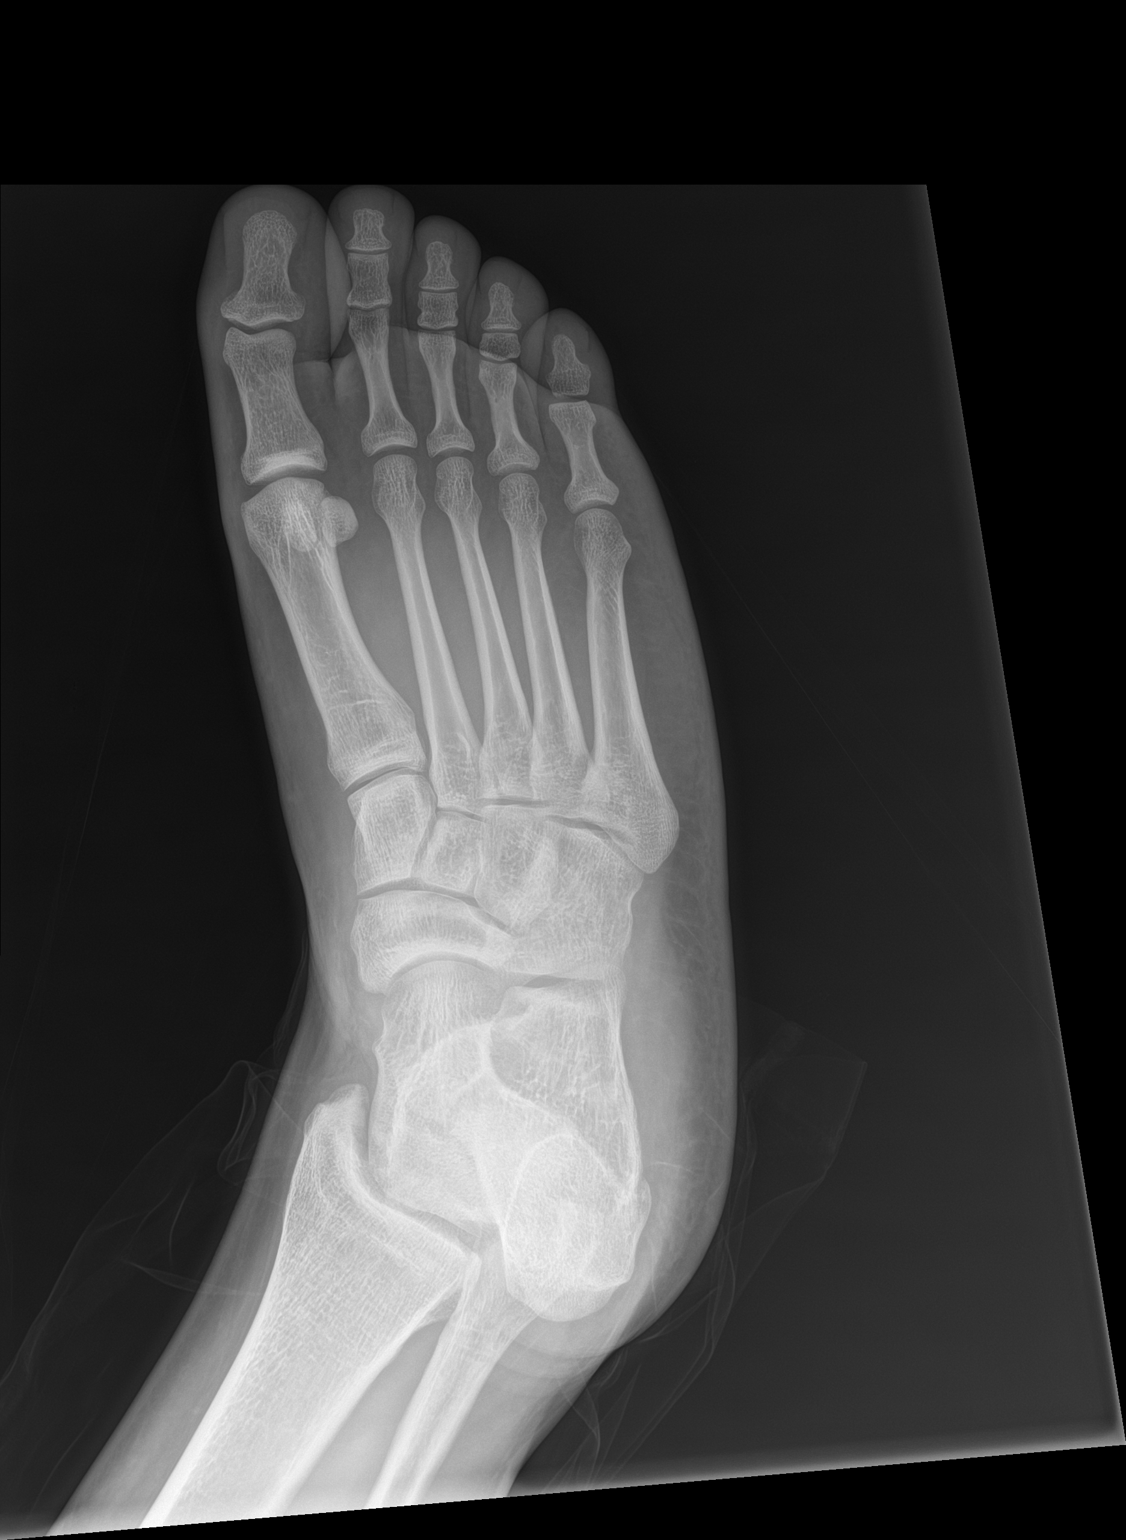

[foot lat]
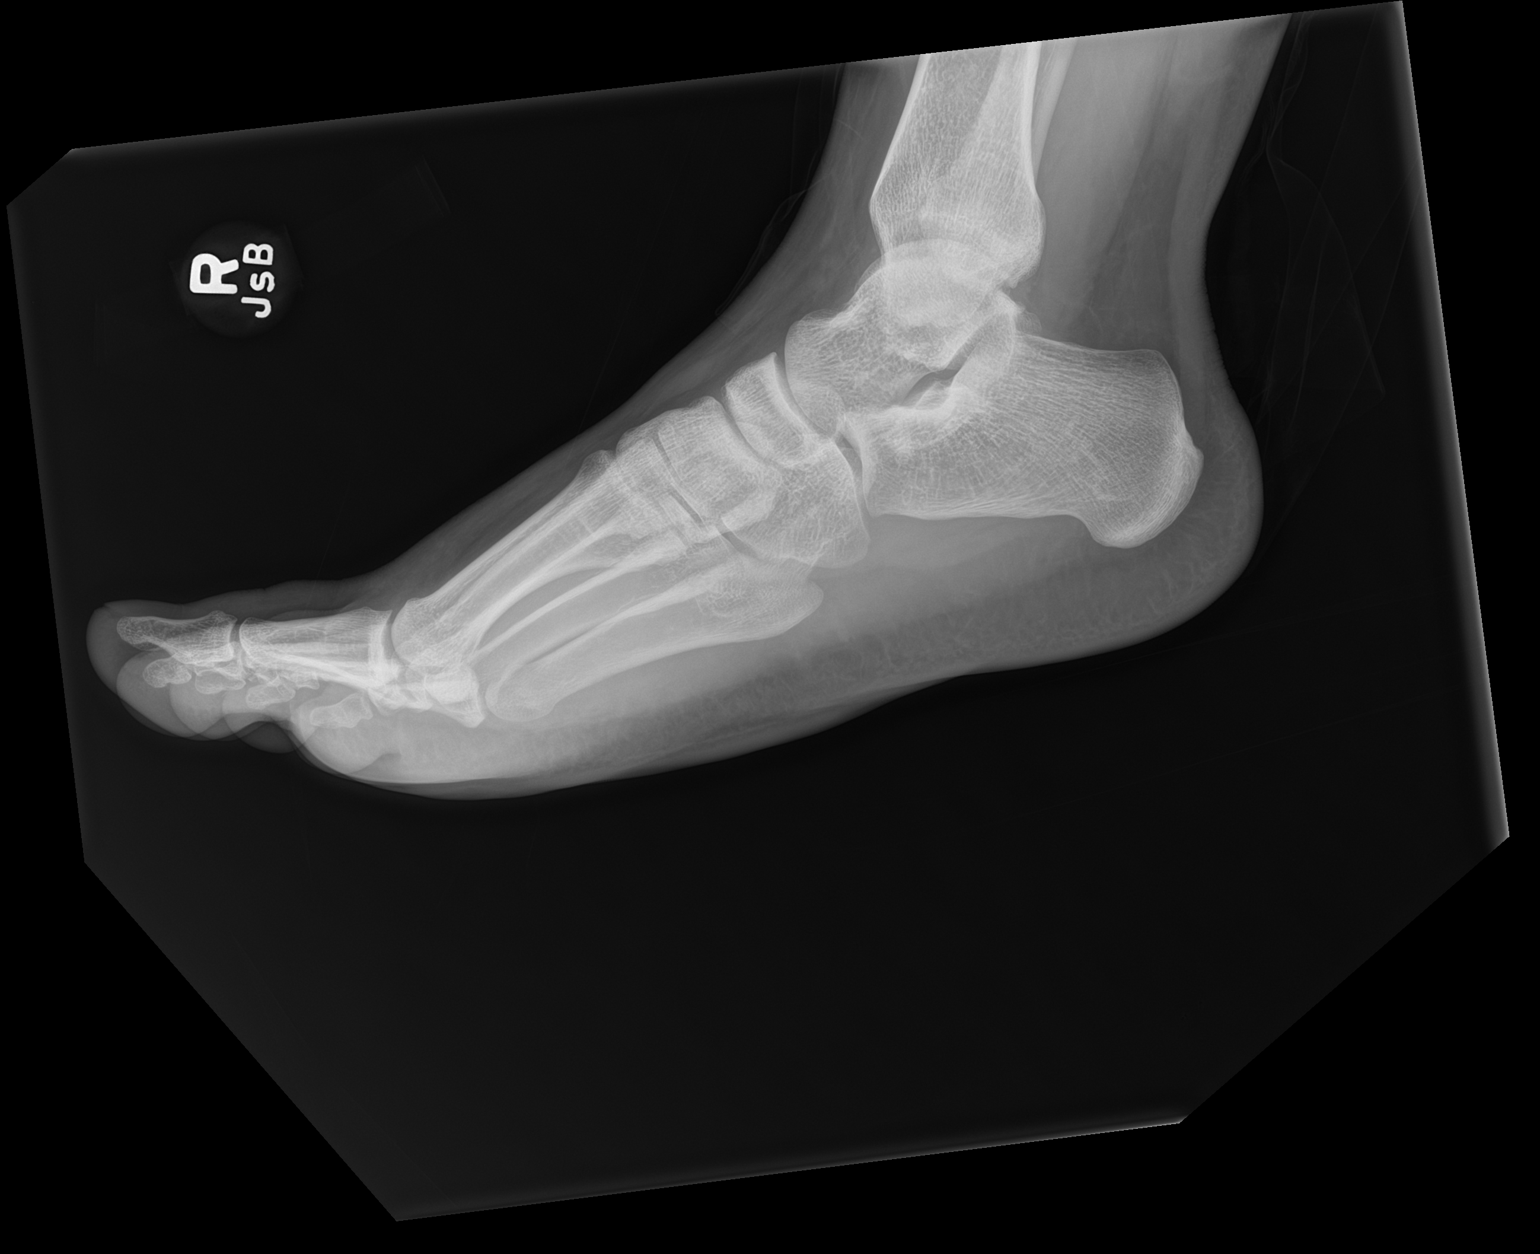

[3 of 3 positions shown; findings below may reference images not displayed]

FINDINGS: There is no evidence of fracture or dislocation. There is no
evidence of arthropathy or other focal bone abnormality. Mild soft
tissue swelling is seen along the plantar aspect of the distal right
foot. No radiopaque soft tissue foreign bodies are identified.
IMPRESSION: No acute osseous abnormality.

## 2020-10-13 ENCOUNTER — Encounter: Payer: Self-pay | Admitting: Internal Medicine

## 2020-10-13 ENCOUNTER — Other Ambulatory Visit: Payer: Self-pay

## 2020-10-13 ENCOUNTER — Ambulatory Visit (INDEPENDENT_AMBULATORY_CARE_PROVIDER_SITE_OTHER): Payer: No Typology Code available for payment source | Admitting: Internal Medicine

## 2020-10-13 VITALS — BP 120/76 | HR 82 | Resp 18 | Ht 69.0 in | Wt 235.0 lb

## 2020-10-13 DIAGNOSIS — Z23 Encounter for immunization: Secondary | ICD-10-CM | POA: Diagnosis not present

## 2020-10-13 DIAGNOSIS — F203 Undifferentiated schizophrenia: Secondary | ICD-10-CM

## 2020-10-13 DIAGNOSIS — E669 Obesity, unspecified: Secondary | ICD-10-CM

## 2020-10-13 DIAGNOSIS — J4520 Mild intermittent asthma, uncomplicated: Secondary | ICD-10-CM | POA: Diagnosis not present

## 2020-10-13 DIAGNOSIS — J45909 Unspecified asthma, uncomplicated: Secondary | ICD-10-CM | POA: Insufficient documentation

## 2020-10-13 DIAGNOSIS — F333 Major depressive disorder, recurrent, severe with psychotic symptoms: Secondary | ICD-10-CM

## 2020-10-13 DIAGNOSIS — Z7689 Persons encountering health services in other specified circumstances: Secondary | ICD-10-CM | POA: Diagnosis not present

## 2020-10-13 NOTE — Progress Notes (Signed)
New Patient Office Visit  Subjective:  Patient ID: Bobby [name], male    DOB: 1996-12-14  Age: 24 y.o. MRN: [id number]  CC:  Chief Complaint  Patient presents with   Follow-up    schizophrenia    HPI Bobby [name] is a 24 year old male with PMH of Schizophrenia and mild, intermittent asthma presents for establishing care. Patient states that he has been in good health overall. He has a history of mild asthma, for which he takes Albuterol inhaler. He uses it only before playing basketball. He has to use around 2-3 times/month. Patient denies dyspnea, chest pain or palpitations at rest.  Patient sees Dr Geanie Cooley for his schizophrenia and has been doing well with his medications. Patient's grandmother has come to office today and states that he has been gaining weight lately. I explained the importance of adhering to the medications for Schizophrenia for now and discuss the medication regimen with Dr Geanie Cooley. Exercise regimen and diet modification was discussed.  Patient is currently not working and stays with his Grandmother.  Patient has had COVID vaccine. He received flu vaccine in the office today.  Past Medical History:  Diagnosis Date   Asthma    prn inhaler   Rupture of radial collateral ligament of right thumb 11/2015    Past Surgical History:  Procedure Laterality Date   LIGAMENT REPAIR Right 12/15/2015   Procedure: RIGHT THUMB RADIAL COLLATERAL LIGAMENT REPAIR;  Surgeon: Betha Loa, MD;  Location: [facility];  Service: Orthopedics;  Laterality: Right;    History reviewed. No pertinent family history.  Social History   Socioeconomic History   Marital status: Single    Spouse name: Not on file   Number of children: Not on file   Years of education: Not on file   Highest education level: Not on file  Occupational History   Not on file  Tobacco Use   Smoking status: Never Smoker   Smokeless tobacco: Never Used  Vaping Use   Vaping Use:  Never used  Substance and Sexual Activity   Alcohol use: No   Drug use: Not Currently    Types: Marijuana   Sexual activity: Not on file  Other Topics Concern   Not on file  Social History Narrative   Not on file   Social Determinants of Health   Financial Resource Strain:    Difficulty of Paying Living Expenses: Not on file  Food Insecurity:    Worried About Running Out of Food in the Last Year: Not on file   Ran Out of Food in the Last Year: Not on file  Transportation Needs:    Lack of Transportation (Medical): Not on file   Lack of Transportation (Non-Medical): Not on file  Physical Activity:    Days of Exercise per Week: Not on file   Minutes of Exercise per Session: Not on file  Stress:    Feeling of Stress : Not on file  Social Connections:    Frequency of Communication with Friends and Family: Not on file   Frequency of Social Gatherings with Friends and Family: Not on file   Attends Religious Services: Not on file   Active Member of Clubs or Organizations: Not on file   Attends Banker Meetings: Not on file   Marital Status: Not on file  Intimate Partner Violence:    Fear of Current or Ex-Partner: Not on file   Emotionally Abused: Not on file   Physically Abused: Not on  file   Sexually Abused: Not on file    ROS Review of Systems  Constitutional: Negative for chills and fever.  HENT: Negative for congestion, sinus pressure, sinus pain and sore throat.   Eyes: Negative for pain and discharge.  Respiratory: Negative for cough and shortness of breath.   Cardiovascular: Negative for chest pain and palpitations.  Gastrointestinal: Negative for constipation, diarrhea, nausea and vomiting.  Endocrine: Negative for polydipsia and polyuria.  Genitourinary: Negative for dysuria and hematuria.  Musculoskeletal: Negative for neck pain and neck stiffness.  Skin: Negative for rash.  Neurological: Negative for dizziness, weakness,  numbness and headaches.  Psychiatric/Behavioral: Negative for agitation and behavioral problems.     Objective:   Today's Vitals: BP 120/76    Pulse 82    Resp 18    Ht 5\' 9"  (1.753 m)    Wt 235 lb (106.6 kg)    SpO2 98%    BMI 34.70 kg/m   Physical Exam Vitals reviewed.  Constitutional:      General: He is not in acute distress.    Appearance: He is not diaphoretic.  HENT:     Head: Normocephalic and atraumatic.     Nose: Nose normal.     Mouth/Throat:     Mouth: Mucous membranes are moist.  Eyes:     General: No scleral icterus.    Extraocular Movements: Extraocular movements intact.     Pupils: Pupils are equal, round, and reactive to light.  Cardiovascular:     Rate and Rhythm: Normal rate and regular rhythm.     Pulses: Normal pulses.     Heart sounds: No murmur heard.   Pulmonary:     Breath sounds: Normal breath sounds. No wheezing or rales.  Abdominal:     Palpations: Abdomen is soft.     Tenderness: There is no abdominal tenderness.  Musculoskeletal:     Cervical back: Neck supple. No tenderness.     Right lower leg: No edema.     Left lower leg: No edema.  Skin:    General: Skin is warm.     Findings: No rash.  Neurological:     General: No focal deficit present.     Mental Status: He is alert and oriented to person, place, and time.     Sensory: No sensory deficit.     Motor: No weakness.  Psychiatric:        Mood and Affect: Mood normal.        Thought Content: Thought content normal.     Assessment & Plan:   Problem List Items Addressed This Visit      Encounter to establish care - Primary   Care established Previous chart reviewed History and medications reviewed with the patient      Respiratory   Asthma    Well-controlled On Albuterol inhaler PRN        Other   MDD (major depressive disorder), recurrent, severe, with psychosis (HCC)    Follows up with Psychiatrist On Trazodone and Atarax with Risperidone and Benztropin (for  schizophrenia)      Undifferentiated schizophrenia (HCC)    Follows up with Dr On Risperidone and Benztropine Patient is functional in day-to-day activities, lives with his grandmother.       Obesity Diet modification and moderate exercise discussed      Other Visit Diagnoses    Need for immunization against influenza       Relevant Orders   Flu Vaccine QUAD 36+  mos IM (Completed)      Outpatient Encounter Medications as of 10/13/2020  Medication Sig   albuterol (VENTOLIN HFA) 108 (90 Base) MCG/ACT inhaler Inhale 2 puffs into the lungs every 6 (six) hours as needed for wheezing or shortness of breath.   benztropine (COGENTIN) 0.5 MG tablet Take 1 tablet (0.5 mg total) by mouth 2 (two) times daily. For prevention of drug induced tremors   Cholecalciferol (VITAMIN D-3) 125 MCG (5000 UT) TABS Take 125 mcg by mouth daily.   hydrOXYzine (ATARAX/VISTARIL) 25 MG tablet Take 1 tablet (25 mg total) by mouth 3 (three) times daily as needed for anxiety.   omega-3 acid ethyl esters (LOVAZA) 1 g capsule Take 1 capsule (1 g total) by mouth 2 (two) times daily. For high cholesterol   risperiDONE (RISPERDAL M-TABS) 3 MG disintegrating tablet Take 1 tablet (3 mg total) by mouth 2 (two) times daily. For mood control   traZODone (DESYREL) 50 MG tablet Take 1 tablet (50 mg total) by mouth at bedtime as needed for sleep.   [DISCONTINUED] ARIPiprazole ER (ABILIFY MAINTENA) 400 MG SRER injection Inject 2 mLs (400 mg total) into the muscle every 14 (fourteen) days. (Due on 12-19-18): For mood control   [DISCONTINUED] ciprofloxacin (CIPRO) 500 MG tablet Take 1 tablet (500 mg total) by mouth 2 (two) times daily.   No facility-administered encounter medications on file as of 10/13/2020.    Follow-up: Return in about 6 months (around 04/13/2021).   Anabel Halon, MD

## 2020-10-13 NOTE — Patient Instructions (Addendum)
Please get blood tests done within a week. We will call you if there is any abnormal blood test result.  Your were given flu vaccine today. It is normal to have local soreness for about a day.  Please continue to take your current medications and follow up with Dr Geanie Cooley as scheduled.  Please follow heart-healthy diet, consisting more of green vegetables and fruits.  Please perform moderate exercise/walking/sports for at least 150 mins/week.

## 2020-10-13 NOTE — Assessment & Plan Note (Signed)
Follows up with Dr Lay On Risperidone and Benztropine Patient is functional in day-to-day activities, lives with his grandmother. 

## 2020-10-13 NOTE — Assessment & Plan Note (Signed)
Care established Previous chart reviewed History and medications reviewed with the patient 

## 2020-10-13 NOTE — Assessment & Plan Note (Addendum)
Well-controlled On Albuterol inhaler PRN 

## 2020-10-13 NOTE — Assessment & Plan Note (Signed)
Follows up with Psychiatrist On Trazodone and Atarax with Risperidone and Benztropin (for schizophrenia) 

## 2020-10-15 LAB — CMP14+EGFR
ALT: 27 IU/L (ref 0–44)
AST: 19 IU/L (ref 0–40)
Albumin/Globulin Ratio: 1.3 (ref 1.2–2.2)
Albumin: 4.4 g/dL (ref 4.1–5.2)
Alkaline Phosphatase: 116 IU/L (ref 44–121)
BUN/Creatinine Ratio: 8 — ABNORMAL LOW (ref 9–20)
BUN: 8 mg/dL (ref 6–20)
Bilirubin Total: 0.5 mg/dL (ref 0.0–1.2)
CO2: 24 mmol/L (ref 20–29)
Calcium: 10 mg/dL (ref 8.7–10.2)
Chloride: 101 mmol/L (ref 96–106)
Creatinine, Ser: 1.02 mg/dL (ref 0.76–1.27)
GFR calc Af Amer: 120 mL/min/{1.73_m2} (ref >59)
GFR calc non Af Amer: 104 mL/min/{1.73_m2} (ref >59)
Globulin, Total: 3.4 g/dL (ref 1.5–4.5)
Glucose: 99 mg/dL (ref 65–99)
Potassium: 4.5 mmol/L (ref 3.5–5.2)
Sodium: 138 mmol/L (ref 134–144)
Total Protein: 7.8 g/dL (ref 6.0–8.5)

## 2020-10-15 LAB — CBC WITH DIFFERENTIAL/PLATELET
Basophils Absolute: 0.1 10*3/uL (ref 0.0–0.2)
Basos: 1 %
EOS (ABSOLUTE): 0.4 10*3/uL (ref 0.0–0.4)
Eos: 5 %
Hematocrit: 44.4 % (ref 37.5–51.0)
Hemoglobin: 14.9 g/dL (ref 13.0–17.7)
Immature Grans (Abs): 0 10*3/uL (ref 0.0–0.1)
Immature Granulocytes: 0 %
Lymphocytes Absolute: 2.3 10*3/uL (ref 0.7–3.1)
Lymphs: 27 %
MCH: 27.8 pg (ref 26.6–33.0)
MCHC: 33.6 g/dL (ref 31.5–35.7)
MCV: 83 fL (ref 79–97)
Monocytes Absolute: 0.8 10*3/uL (ref 0.1–0.9)
Monocytes: 9 %
Neutrophils Absolute: 4.8 10*3/uL (ref 1.4–7.0)
Neutrophils: 58 %
Platelets: 354 10*3/uL (ref 150–450)
RBC: 5.36 x10E6/uL (ref 4.14–5.80)
RDW: 14.8 % (ref 11.6–15.4)
WBC: 8.4 10*3/uL (ref 3.4–10.8)

## 2020-10-15 LAB — LIPID PANEL
Chol/HDL Ratio: 5.4 ratio — ABNORMAL HIGH (ref 0.0–5.0)
Cholesterol, Total: 204 mg/dL — ABNORMAL HIGH (ref 100–199)
HDL: 38 mg/dL — ABNORMAL LOW (ref >39)
LDL Chol Calc (NIH): 122 mg/dL — ABNORMAL HIGH (ref 0–99)
Triglycerides: 248 mg/dL — ABNORMAL HIGH (ref 0–149)
VLDL Cholesterol Cal: 44 mg/dL — ABNORMAL HIGH (ref 5–40)

## 2020-10-15 LAB — HEMOGLOBIN A1C
Est. average glucose Bld gHb Est-mCnc: 123 mg/dL
Hgb A1c MFr Bld: 5.9 % — ABNORMAL HIGH (ref 4.8–5.6)

## 2020-10-15 LAB — TSH: TSH: 1.74 u[IU]/mL (ref 0.450–4.500)

## 2020-10-15 LAB — HEPATITIS C ANTIBODY: Hep C Virus Ab: <0.1 s/co ratio (ref 0.0–0.9)

## 2020-12-15 ENCOUNTER — Ambulatory Visit: Payer: Self-pay

## 2020-12-15 ENCOUNTER — Ambulatory Visit: Payer: BC Managed Care – PPO | Attending: Internal Medicine

## 2020-12-15 DIAGNOSIS — Z23 Encounter for immunization: Secondary | ICD-10-CM

## 2020-12-15 NOTE — Progress Notes (Signed)
   Covid-19 Vaccination Clinic  Name:  Bobby [name]    MRN: [id number] DOB: 1996/05/02  12/15/2020  [name] was observed post Covid-19 immunization for 15 minutes without incident. He was provided with Vaccine Information Sheet and instruction to access the V-Safe system.   [name] was instructed to call 911 with any severe reactions post vaccine: Marland Kitchen Difficulty breathing  . Swelling of face and throat  . A fast heartbeat  . A bad rash all over body  . Dizziness and weakness   Immunizations Administered    Name Date Dose VIS Date Route   Moderna Covid-19 Booster Vaccine 12/15/2020  1:20 PM 0.25 mL 10/12/2020 Intramuscular   Manufacturer: Moderna   Lot: [id number]   NDC: 00712-197-58

## 2021-04-18 ENCOUNTER — Encounter: Payer: Self-pay | Admitting: Internal Medicine

## 2021-04-18 ENCOUNTER — Other Ambulatory Visit: Payer: Self-pay

## 2021-04-18 ENCOUNTER — Ambulatory Visit (INDEPENDENT_AMBULATORY_CARE_PROVIDER_SITE_OTHER): Payer: No Typology Code available for payment source | Admitting: Internal Medicine

## 2021-04-18 VITALS — BP 130/82 | HR 59 | Resp 18 | Ht 69.0 in | Wt 229.1 lb

## 2021-04-18 DIAGNOSIS — F203 Undifferentiated schizophrenia: Secondary | ICD-10-CM

## 2021-04-18 DIAGNOSIS — R7303 Prediabetes: Secondary | ICD-10-CM | POA: Insufficient documentation

## 2021-04-18 DIAGNOSIS — Z114 Encounter for screening for human immunodeficiency virus [HIV]: Secondary | ICD-10-CM

## 2021-04-18 DIAGNOSIS — E669 Obesity, unspecified: Secondary | ICD-10-CM | POA: Insufficient documentation

## 2021-04-18 DIAGNOSIS — J4520 Mild intermittent asthma, uncomplicated: Secondary | ICD-10-CM | POA: Diagnosis not present

## 2021-04-18 DIAGNOSIS — E782 Mixed hyperlipidemia: Secondary | ICD-10-CM | POA: Diagnosis not present

## 2021-04-18 NOTE — Assessment & Plan Note (Signed)
Well-controlled On Albuterol inhaler PRN

## 2021-04-18 NOTE — Progress Notes (Signed)
Established Patient Office Visit  Subjective:  Patient ID: Bobby [name], male    DOB: 04/10/1996  Age: 25 y.o. MRN: [id number]  CC:  Chief Complaint  Patient presents with  . Follow-up    6 month follow up     HPI Bobby [name] is a 25 year old male with PMH of Schizophrenia and mild, intermittent asthma who presents for follow up of his chronic medical conditions.  Prediabetes, HLD and obesity: He has lost about 6 lbs since last visit. He tries to walk about 30 mins/day.  Schizophrenia: Patient sees Dr Hoyle Barr for his schizophrenia and has been doing well with his medications. Patient's grandmother has come to office today. He is overall independent with his ADLs.  Past Medical History:  Diagnosis Date  . Asthma    prn inhaler  . Rupture of radial collateral ligament of right thumb 11/2015    Past Surgical History:  Procedure Laterality Date  . LIGAMENT REPAIR Right 12/15/2015   Procedure: RIGHT THUMB RADIAL COLLATERAL LIGAMENT REPAIR;  Surgeon: Leanora Cover, MD;  Location: [place];  Service: Orthopedics;  Laterality: Right;    History reviewed. No pertinent family history.  Social History   Socioeconomic History  . Marital status: Single    Spouse name: Not on file  . Number of children: Not on file  . Years of education: Not on file  . Highest education level: Not on file  Occupational History  . Not on file  Tobacco Use  . Smoking status: Never Smoker  . Smokeless tobacco: Never Used  Vaping Use  . Vaping Use: Never used  Substance and Sexual Activity  . Alcohol use: No  . Drug use: Not Currently    Types: Marijuana  . Sexual activity: Not on file  Other Topics Concern  . Not on file  Social History Narrative  . Not on file   Social Determinants of Health   Financial Resource Strain: Not on file  Food Insecurity: Not on file  Transportation Needs: Not on file  Physical Activity: Not on file  Stress: Not on file  Social  Connections: Not on file  Intimate Partner Violence: Not on file    Outpatient Medications Prior to Visit  Medication Sig Dispense Refill  . albuterol (VENTOLIN HFA) 108 (90 Base) MCG/ACT inhaler Inhale 2 puffs into the lungs every 6 (six) hours as needed for wheezing or shortness of breath. 18 g 5  . benztropine (COGENTIN) 0.5 MG tablet Take 1 tablet (0.5 mg total) by mouth 2 (two) times daily. For prevention of drug induced tremors 60 tablet 0  . Cholecalciferol (VITAMIN D-3) 125 MCG (5000 UT) TABS Take 125 mcg by mouth daily.    . hydrOXYzine (ATARAX/VISTARIL) 25 MG tablet Take 1 tablet (25 mg total) by mouth 3 (three) times daily as needed for anxiety. 60 tablet 0  . omega-3 acid ethyl esters (LOVAZA) 1 g capsule Take 1 capsule (1 g total) by mouth 2 (two) times daily. For high cholesterol 60 capsule 0  . risperiDONE (RISPERDAL M-TABS) 3 MG disintegrating tablet Take 1 tablet (3 mg total) by mouth 2 (two) times daily. For mood control 60 tablet 0  . traZODone (DESYREL) 50 MG tablet Take 1 tablet (50 mg total) by mouth at bedtime as needed for sleep. 30 tablet 0   No facility-administered medications prior to visit.    No Known Allergies  ROS Review of Systems  Constitutional: Negative for chills and fever.  HENT: Negative  for congestion, sinus pressure, sinus pain and sore throat.   Eyes: Negative for pain and discharge.  Respiratory: Negative for cough and shortness of breath.   Cardiovascular: Negative for chest pain and palpitations.  Gastrointestinal: Negative for constipation, diarrhea, nausea and vomiting.  Endocrine: Negative for polydipsia and polyuria.  Genitourinary: Negative for dysuria and hematuria.  Musculoskeletal: Negative for neck pain and neck stiffness.  Skin: Negative for rash.  Neurological: Negative for dizziness, weakness, numbness and headaches.  Psychiatric/Behavioral: Negative for agitation and behavioral problems.      Objective:    Physical  Exam Vitals reviewed.  Constitutional:      General: He is not in acute distress.    Appearance: He is not diaphoretic.  HENT:     Head: Normocephalic and atraumatic.     Nose: Nose normal.     Mouth/Throat:     Mouth: Mucous membranes are moist.  Eyes:     General: No scleral icterus.    Extraocular Movements: Extraocular movements intact.     Pupils: Pupils are equal, round, and reactive to light.  Cardiovascular:     Rate and Rhythm: Normal rate and regular rhythm.     Pulses: Normal pulses.     Heart sounds: No murmur heard.   Pulmonary:     Breath sounds: Normal breath sounds. No wheezing or rales.  Abdominal:     Palpations: Abdomen is soft.     Tenderness: There is no abdominal tenderness.  Musculoskeletal:     Cervical back: Neck supple. No tenderness.     Right lower leg: No edema.     Left lower leg: No edema.  Skin:    General: Skin is warm.     Findings: No rash.  Neurological:     General: No focal deficit present.     Mental Status: He is alert and oriented to person, place, and time.     Sensory: No sensory deficit.     Motor: No weakness.  Psychiatric:        Mood and Affect: Mood normal.        Thought Content: Thought content normal.     BP 130/82 (BP Location: Right Arm, Patient Position: Sitting, Cuff Size: Normal)   Pulse (!) 59   Resp 18   Ht 5' 9"  (1.753 m)   Wt 229 lb 1.9 oz (103.9 kg)   SpO2 97%   BMI 33.84 kg/m  Wt Readings from Last 3 Encounters:  04/18/21 229 lb 1.9 oz (103.9 kg)  10/13/20 235 lb (106.6 kg)  08/13/20 236 lb (107 kg)     Health Maintenance Due  Topic Date Due  . HPV VACCINES (1 - Male 2-dose series) Never done       Topic Date Due  . HPV VACCINES (1 - Male 2-dose series) Never done    Lab Results  Component Value Date   TSH 1.740 10/14/2020   Lab Results  Component Value Date   WBC 8.4 10/14/2020   HGB 14.9 10/14/2020   HCT 44.4 10/14/2020   MCV 83 10/14/2020   PLT 354 10/14/2020   Lab Results   Component Value Date   NA 138 10/14/2020   K 4.5 10/14/2020   CO2 24 10/14/2020   GLUCOSE 99 10/14/2020   BUN 8 10/14/2020   CREATININE 1.02 10/14/2020   BILITOT 0.5 10/14/2020   ALKPHOS 116 10/14/2020   AST 19 10/14/2020   ALT 27 10/14/2020   PROT 7.8 10/14/2020   ALBUMIN  4.4 10/14/2020   CALCIUM 10.0 10/14/2020   ANIONGAP 9 12/01/2018   Lab Results  Component Value Date   CHOL 204 (H) 10/14/2020   Lab Results  Component Value Date   HDL 38 (L) 10/14/2020   Lab Results  Component Value Date   LDLCALC 122 (H) 10/14/2020   Lab Results  Component Value Date   TRIG 248 (H) 10/14/2020   Lab Results  Component Value Date   CHOLHDL 5.4 (H) 10/14/2020   Lab Results  Component Value Date   HGBA1C 5.9 (H) 10/14/2020      Assessment & Plan:   Problem List Items Addressed This Visit      Respiratory   Asthma    Well-controlled On Albuterol inhaler PRN        Other   Undifferentiated schizophrenia ([place])    Follows up with Dr Hoyle Barr On Risperidone and Benztropine Patient is functional in day-to-day activities, lives with his grandmother.      Relevant Orders   CBC with Differential/Platelet   Vitamin D (25 hydroxy)   Prediabetes - Primary    Lab Results  Component Value Date   HGBA1C 5.9 (H) 10/14/2020   Advised to follow low carb and low cholesterol diet      Relevant Orders   CMP14+EGFR   Lipid panel   Hemoglobin A1c   Mixed hyperlipidemia    Lipid profile reviewed Advised to follow low cholesterol diet and perform moderate exercise/walking at least 150 mins/week      Relevant Orders   Lipid panel   Obesity (BMI 30-39.9)    Some of psychiatric medications might be contributing to weight gain Has done well since last visit, lost 6 lbs with exercise/walking Advised for diet modification and moderate exercise/walking       Other Visit Diagnoses    Screening for HIV without presence of risk factors       Relevant Orders   HIV antibody (with  reflex)      No orders of the defined types were placed in this encounter.   Follow-up: Return in about 6 months (around 10/18/2021) for Annual physical.    Lindell Spar, MD

## 2021-04-18 NOTE — Assessment & Plan Note (Signed)
Follows up with Dr Lay On Risperidone and Benztropine Patient is functional in day-to-day activities, lives with his grandmother. 

## 2021-04-18 NOTE — Patient Instructions (Signed)
Please continue to take medications as prescribed and follow up with Dr Geanie Cooley as scheduled.  Please continue to engage in moderate exercise/walking at least 150 mins/week.  Please avoid oily and fried food and follow heart healthy diet consisting of green vegetables and fruits.

## 2021-04-18 NOTE — Assessment & Plan Note (Signed)
Some of psychiatric medications might be contributing to weight gain Has done well since last visit, lost 6 lbs with exercise/walking Advised for diet modification and moderate exercise/walking

## 2021-04-18 NOTE — Assessment & Plan Note (Signed)
Lipid profile reviewed Advised to follow low cholesterol diet and perform moderate exercise/walking at least 150 mins/week 

## 2021-04-18 NOTE — Assessment & Plan Note (Signed)
Lab Results  Component Value Date   HGBA1C 5.9 (H) 10/14/2020   Advised to follow low carb and low cholesterol diet

## 2021-10-19 ENCOUNTER — Encounter: Payer: BC Managed Care – PPO | Admitting: Internal Medicine

## 2022-01-22 ENCOUNTER — Other Ambulatory Visit: Payer: Self-pay

## 2022-01-22 DIAGNOSIS — E782 Mixed hyperlipidemia: Secondary | ICD-10-CM

## 2022-01-22 DIAGNOSIS — Z114 Encounter for screening for human immunodeficiency virus [HIV]: Secondary | ICD-10-CM

## 2022-01-22 DIAGNOSIS — R7303 Prediabetes: Secondary | ICD-10-CM

## 2022-01-22 DIAGNOSIS — E559 Vitamin D deficiency, unspecified: Secondary | ICD-10-CM

## 2022-01-22 DIAGNOSIS — E669 Obesity, unspecified: Secondary | ICD-10-CM

## 2022-01-23 ENCOUNTER — Ambulatory Visit (INDEPENDENT_AMBULATORY_CARE_PROVIDER_SITE_OTHER): Payer: No Typology Code available for payment source | Admitting: Internal Medicine

## 2022-01-23 ENCOUNTER — Encounter: Payer: Self-pay | Admitting: Internal Medicine

## 2022-01-23 ENCOUNTER — Other Ambulatory Visit: Payer: Self-pay

## 2022-01-23 VITALS — BP 138/88 | HR 80 | Resp 18 | Ht 69.0 in | Wt 240.1 lb

## 2022-01-23 DIAGNOSIS — K5900 Constipation, unspecified: Secondary | ICD-10-CM

## 2022-01-23 DIAGNOSIS — Z0001 Encounter for general adult medical examination with abnormal findings: Secondary | ICD-10-CM | POA: Diagnosis not present

## 2022-01-23 DIAGNOSIS — K649 Unspecified hemorrhoids: Secondary | ICD-10-CM

## 2022-01-23 DIAGNOSIS — Z23 Encounter for immunization: Secondary | ICD-10-CM

## 2022-01-23 DIAGNOSIS — R7303 Prediabetes: Secondary | ICD-10-CM | POA: Diagnosis not present

## 2022-01-23 DIAGNOSIS — E669 Obesity, unspecified: Secondary | ICD-10-CM

## 2022-01-23 LAB — CBC WITH DIFFERENTIAL/PLATELET
Basophils Absolute: 0.1 10*3/uL (ref 0.0–0.2)
Basos: 1 %
EOS (ABSOLUTE): 0.3 10*3/uL (ref 0.0–0.4)
Eos: 4 %
Hematocrit: 42.4 % (ref 37.5–51.0)
Hemoglobin: 14.2 g/dL (ref 13.0–17.7)
Immature Grans (Abs): 0 10*3/uL (ref 0.0–0.1)
Immature Granulocytes: 0 %
Lymphocytes Absolute: 2.2 10*3/uL (ref 0.7–3.1)
Lymphs: 28 %
MCH: 27.6 pg (ref 26.6–33.0)
MCHC: 33.5 g/dL (ref 31.5–35.7)
MCV: 82 fL (ref 79–97)
Monocytes Absolute: 0.7 10*3/uL (ref 0.1–0.9)
Monocytes: 9 %
Neutrophils Absolute: 4.6 10*3/uL (ref 1.4–7.0)
Neutrophils: 58 %
Platelets: 336 10*3/uL (ref 150–450)
RBC: 5.15 x10E6/uL (ref 4.14–5.80)
RDW: 13.5 % (ref 11.6–15.4)
WBC: 7.9 10*3/uL (ref 3.4–10.8)

## 2022-01-23 LAB — CMP14+EGFR
ALT: 31 IU/L (ref 0–44)
AST: 24 IU/L (ref 0–40)
Albumin/Globulin Ratio: 1.5 (ref 1.2–2.2)
Albumin: 4.4 g/dL (ref 4.1–5.2)
Alkaline Phosphatase: 85 IU/L (ref 44–121)
BUN/Creatinine Ratio: 10 (ref 9–20)
BUN: 10 mg/dL (ref 6–20)
Bilirubin Total: 0.5 mg/dL (ref 0.0–1.2)
CO2: 23 mmol/L (ref 20–29)
Calcium: 9.6 mg/dL (ref 8.7–10.2)
Chloride: 101 mmol/L (ref 96–106)
Creatinine, Ser: 0.98 mg/dL (ref 0.76–1.27)
Globulin, Total: 3 g/dL (ref 1.5–4.5)
Glucose: 95 mg/dL (ref 70–99)
Potassium: 4.7 mmol/L (ref 3.5–5.2)
Sodium: 139 mmol/L (ref 134–144)
Total Protein: 7.4 g/dL (ref 6.0–8.5)
eGFR: 110 mL/min/{1.73_m2} (ref >59)

## 2022-01-23 LAB — LIPID PANEL
Chol/HDL Ratio: 4.3 ratio (ref 0.0–5.0)
Cholesterol, Total: 155 mg/dL (ref 100–199)
HDL: 36 mg/dL — ABNORMAL LOW (ref >39)
LDL Chol Calc (NIH): 90 mg/dL (ref 0–99)
Triglycerides: 168 mg/dL — ABNORMAL HIGH (ref 0–149)
VLDL Cholesterol Cal: 29 mg/dL (ref 5–40)

## 2022-01-23 LAB — VITAMIN D 25 HYDROXY (VIT D DEFICIENCY, FRACTURES): Vit D, 25-Hydroxy: 22.4 ng/mL — ABNORMAL LOW (ref 30.0–100.0)

## 2022-01-23 LAB — HEMOGLOBIN A1C
Est. average glucose Bld gHb Est-mCnc: 131 mg/dL
Hgb A1c MFr Bld: 6.2 % — ABNORMAL HIGH (ref 4.8–5.6)

## 2022-01-23 LAB — HIV ANTIBODY (ROUTINE TESTING W REFLEX): HIV Screen 4th Generation wRfx: NONREACTIVE

## 2022-01-23 MED ORDER — DOCUSATE SODIUM 100 MG PO CAPS: 100.0000 mg | ORAL_CAPSULE | Freq: Every day | ORAL | 5 refills | Status: DC | PRN

## 2022-01-23 MED ORDER — HYDROCORTISONE (PERIANAL) 2.5 % EX CREA: 1.0000 "application " | TOPICAL_CREAM | Freq: Two times a day (BID) | CUTANEOUS | 0 refills | Status: DC

## 2022-01-23 NOTE — Progress Notes (Signed)
Established Patient Office Visit  Subjective:  Patient ID: Bobby [name], male    DOB: 09/23/1996  Age: 26 y.o. MRN: [id number]  CC:  Chief Complaint  Patient presents with   Annual Exam    Annual exam pt has been noticing blood after using the bathroom when he wipes for about 1 week its red blood     HPI ITALO BANTON is a 25 y.o. male with past medical history of Schizophrenia and mild, intermittent asthma who presents for annual physical.  Prediabetes: His HbA1c has increased to 6.1 now.  He admits that he has fried food almost every day and will try to cut it down.  He reports noticing bright red blood on tissue paper after wiping.  He also reports having rectal pain at times.  He also has chronic constipation and has to strain at times.  Denies any melena or hematochezia currently.  Past Medical History:  Diagnosis Date   Asthma    prn inhaler   Rupture of radial collateral ligament of right thumb 11/2015    Past Surgical History:  Procedure Laterality Date   LIGAMENT REPAIR Right 12/15/2015   Procedure: RIGHT THUMB RADIAL COLLATERAL LIGAMENT REPAIR;  Surgeon: Leanora Cover, MD;  Location: [place];  Service: Orthopedics;  Laterality: Right;    History reviewed. No pertinent family history.  Social History   Socioeconomic History   Marital status: Single    Spouse name: Not on file   Number of children: Not on file   Years of education: Not on file   Highest education level: Not on file  Occupational History   Not on file  Tobacco Use   Smoking status: Never   Smokeless tobacco: Never  Vaping Use   Vaping Use: Never used  Substance and Sexual Activity   Alcohol use: No   Drug use: Not Currently    Types: Marijuana   Sexual activity: Not on file  Other Topics Concern   Not on file  Social History Narrative   Not on file   Social Determinants of Health   Financial Resource Strain: Not on file  Food Insecurity: Not on file   Transportation Needs: Not on file  Physical Activity: Not on file  Stress: Not on file  Social Connections: Not on file  Intimate Partner Violence: Not on file    Outpatient Medications Prior to Visit  Medication Sig Dispense Refill   albuterol (VENTOLIN HFA) 108 (90 Base) MCG/ACT inhaler Inhale 2 puffs into the lungs every 6 (six) hours as needed for wheezing or shortness of breath. 18 g 5   benztropine (COGENTIN) 0.5 MG tablet Take 1 tablet (0.5 mg total) by mouth 2 (two) times daily. For prevention of drug induced tremors 60 tablet 0   Cholecalciferol (VITAMIN D-3) 125 MCG (5000 UT) TABS Take 125 mcg by mouth daily.     hydrOXYzine (ATARAX/VISTARIL) 25 MG tablet Take 1 tablet (25 mg total) by mouth 3 (three) times daily as needed for anxiety. 60 tablet 0   omega-3 acid ethyl esters (LOVAZA) 1 g capsule Take 1 capsule (1 g total) by mouth 2 (two) times daily. For high cholesterol 60 capsule 0   risperiDONE (RISPERDAL M-TABS) 3 MG disintegrating tablet Take 1 tablet (3 mg total) by mouth 2 (two) times daily. For mood control 60 tablet 0   traZODone (DESYREL) 50 MG tablet Take 1 tablet (50 mg total) by mouth at bedtime as needed for sleep. 30 tablet 0  No facility-administered medications prior to visit.    No Known Allergies  ROS Review of Systems  Constitutional:  Negative for chills and fever.  HENT:  Negative for congestion, sinus pressure, sinus pain and sore throat.   Eyes:  Negative for pain and discharge.  Respiratory:  Negative for cough and shortness of breath.   Cardiovascular:  Negative for chest pain and palpitations.  Gastrointestinal:  Positive for anal bleeding and rectal pain. Negative for diarrhea, nausea and vomiting.  Endocrine: Negative for polydipsia and polyuria.  Genitourinary:  Negative for dysuria and hematuria.  Musculoskeletal:  Negative for neck pain and neck stiffness.  Skin:  Negative for rash.  Neurological:  Negative for dizziness, weakness,  numbness and headaches.  Psychiatric/Behavioral:  Negative for agitation and behavioral problems.      Objective:    Physical Exam Vitals reviewed.  Constitutional:      General: He is not in acute distress.    Appearance: He is obese. He is not diaphoretic.  HENT:     Head: Normocephalic and atraumatic.     Nose: Nose normal.     Mouth/Throat:     Mouth: Mucous membranes are moist.  Eyes:     General: No scleral icterus.    Extraocular Movements: Extraocular movements intact.  Cardiovascular:     Rate and Rhythm: Normal rate and regular rhythm.     Pulses: Normal pulses.     Heart sounds: Normal heart sounds. No murmur heard. Pulmonary:     Breath sounds: Normal breath sounds. No wheezing or rales.  Abdominal:     Palpations: Abdomen is soft.     Tenderness: There is no abdominal tenderness.  Genitourinary:    Comments: Anal fissure and hemorrhoid Musculoskeletal:     Cervical back: Neck supple. No tenderness.     Right lower leg: No edema.     Left lower leg: No edema.  Skin:    General: Skin is warm.     Findings: No rash.  Neurological:     General: No focal deficit present.     Mental Status: He is alert and oriented to person, place, and time.     Sensory: No sensory deficit.     Motor: No weakness.  Psychiatric:        Mood and Affect: Mood normal.        Thought Content: Thought content normal.    BP 138/88 (BP Location: Left Arm, Patient Position: Sitting, Cuff Size: Normal)    Pulse 80    Resp 18    Ht 5' 9"  (1.753 m)    Wt 240 lb 1.3 oz (108.9 kg)    SpO2 95%    BMI 35.45 kg/m  Wt Readings from Last 3 Encounters:  01/23/22 240 lb 1.3 oz (108.9 kg)  04/18/21 229 lb 1.9 oz (103.9 kg)  10/13/20 235 lb (106.6 kg)    Lab Results  Component Value Date   TSH 1.740 10/14/2020   Lab Results  Component Value Date   WBC 7.9 01/22/2022   HGB 14.2 01/22/2022   HCT 42.4 01/22/2022   MCV 82 01/22/2022   PLT 336 01/22/2022   Lab Results  Component Value  Date   NA 139 01/22/2022   K 4.7 01/22/2022   CO2 23 01/22/2022   GLUCOSE 95 01/22/2022   BUN 10 01/22/2022   CREATININE 0.98 01/22/2022   BILITOT 0.5 01/22/2022   ALKPHOS 85 01/22/2022   AST 24 01/22/2022   ALT 31 01/22/2022  PROT 7.4 01/22/2022   ALBUMIN 4.4 01/22/2022   CALCIUM 9.6 01/22/2022   ANIONGAP 9 12/01/2018   EGFR 110 01/22/2022   Lab Results  Component Value Date   CHOL 155 01/22/2022   Lab Results  Component Value Date   HDL 36 (L) 01/22/2022   Lab Results  Component Value Date   LDLCALC 90 01/22/2022   Lab Results  Component Value Date   TRIG 168 (H) 01/22/2022   Lab Results  Component Value Date   CHOLHDL 4.3 01/22/2022   Lab Results  Component Value Date   HGBA1C 6.2 (H) 01/22/2022      Assessment & Plan:   Problem List Items Addressed This Visit       Cardiovascular and Mediastinum   Hemorrhoids    Anusol cream Advised to improve hydration to avoid constipation Colace as needed for constipation      Relevant Medications   hydrocortisone (ANUSOL-HC) 2.5 % rectal cream     Other   Prediabetes    Lab Results  Component Value Date   HGBA1C 6.2 (H) 01/22/2022  Advised to follow low carb and low cholesterol diet      Obesity (BMI 30-39.9)    Some of psychiatric medications might be contributing to weight gain Has done well overall with exercise/walking Advised for diet modification and moderate exercise/walking      Encounter for general adult medical examination with abnormal findings - Primary    Physical exam as documented. Flu vaccine given today. Fasting blood test reviewed and discussed with the patient in detail.      Other Visit Diagnoses     Constipation, unspecified constipation type       Relevant Medications   docusate sodium (COLACE) 100 MG capsule       Meds ordered this encounter  Medications   hydrocortisone (ANUSOL-HC) 2.5 % rectal cream    Sig: Place 1 application rectally 2 (two) times daily.     Dispense:  30 g    Refill:  0   docusate sodium (COLACE) 100 MG capsule    Sig: Take 1 capsule (100 mg total) by mouth daily as needed for mild constipation.    Dispense:  30 capsule    Refill:  5    Follow-up: Return in about 6 months (around 07/23/2022) for Prediabetes.    Lindell Spar, MD

## 2022-01-23 NOTE — Progress Notes (Signed)
an

## 2022-01-23 NOTE — Assessment & Plan Note (Signed)
Anusol cream Advised to improve hydration to avoid constipation Colace as needed for constipation

## 2022-01-23 NOTE — Assessment & Plan Note (Signed)
Physical exam as documented. Flu vaccine given today. Fasting blood test reviewed and discussed with the patient in detail. 

## 2022-01-23 NOTE — Patient Instructions (Addendum)
Please increase water intake to at least 64 ounces of fluid in a day.  Please use Anusol cream for rectal pain.  Start taking Colace once daily as needed for constipation. Okay to take Prune juice for constipation.  Please start taking Vitamin D 2000 IU once daily.  Please follow low carb diet and perform moderate exercise/walking at least 150 mins/week. Avoid oily and fried food. Avoid high carb food, such as cookie, candy, pasta, pizza, burgers and bread products. Green vegetables, fruits and seafood are better food choices.

## 2022-01-23 NOTE — Assessment & Plan Note (Signed)
Some of psychiatric medications might be contributing to weight gain Has done well overall with exercise/walking Advised for diet modification and moderate exercise/walking

## 2022-01-23 NOTE — Assessment & Plan Note (Signed)
Lab Results  Component Value Date   HGBA1C 6.2 (H) 01/22/2022   Advised to follow low carb and low cholesterol diet

## 2022-07-23 ENCOUNTER — Encounter: Payer: Self-pay | Admitting: Internal Medicine

## 2022-07-23 ENCOUNTER — Ambulatory Visit (INDEPENDENT_AMBULATORY_CARE_PROVIDER_SITE_OTHER): Payer: BC Managed Care – PPO | Admitting: Internal Medicine

## 2022-07-23 VITALS — BP 122/84 | HR 60 | Resp 18 | Ht 69.0 in | Wt 236.4 lb

## 2022-07-23 DIAGNOSIS — E782 Mixed hyperlipidemia: Secondary | ICD-10-CM

## 2022-07-23 DIAGNOSIS — F203 Undifferentiated schizophrenia: Secondary | ICD-10-CM

## 2022-07-23 DIAGNOSIS — M79644 Pain in right finger(s): Secondary | ICD-10-CM

## 2022-07-23 DIAGNOSIS — F333 Major depressive disorder, recurrent, severe with psychotic symptoms: Secondary | ICD-10-CM | POA: Diagnosis not present

## 2022-07-23 DIAGNOSIS — R7303 Prediabetes: Secondary | ICD-10-CM

## 2022-07-23 DIAGNOSIS — G8929 Other chronic pain: Secondary | ICD-10-CM

## 2022-07-23 DIAGNOSIS — E669 Obesity, unspecified: Secondary | ICD-10-CM

## 2022-07-23 LAB — POCT GLYCOSYLATED HEMOGLOBIN (HGB A1C)
HbA1c POC (<> result, manual entry): 6 % (ref 4.0–5.6)
HbA1c, POC (controlled diabetic range): 6 % (ref 0.0–7.0)
HbA1c, POC (prediabetic range): 6 % (ref 5.7–6.4)

## 2022-07-23 MED ORDER — NAPROXEN 500 MG PO TABS: 500.0000 mg | ORAL_TABLET | Freq: Two times a day (BID) | ORAL | 1 refills | Status: DC

## 2022-07-23 NOTE — Progress Notes (Signed)
Established Patient Office Visit  Subjective:  Patient ID: Bobby [name], male    DOB: August 01, 1996  Age: 26 y.o. MRN: [id number]  CC:  Chief Complaint  Patient presents with   Follow-up    6 month follow up prediabetes pt has been having right thumb pain broke this playing basketball in 2017    HPI Bobby [name] is a 26 y.o. male with past medical history of schizophrenia and mild, intermittent asthma who presents for f/u of his chronic medical conditions.  Prediabetes: His HbA1c is stable at 6.0 now.  He admits that he has fried food almost every day and will try to cut it down.   Schizophrenia: Patient sees Dr Hoyle Barr for his schizophrenia and has been doing well with his medications. Patient's grandmother has come to office today. He is overall independent with his ADLs.  He complains of right thumb base pain since 2016, when he had injury while playing basketball.  He had right thumb radial collateral ligament repair and had OT after it.  He still complains of chronic, sharp thumb pain.  Denies any numbness or tingling of the hand.  Past Medical History:  Diagnosis Date   Asthma    prn inhaler   Rupture of radial collateral ligament of right thumb 11/2015    Past Surgical History:  Procedure Laterality Date   LIGAMENT REPAIR Right 12/15/2015   Procedure: RIGHT THUMB RADIAL COLLATERAL LIGAMENT REPAIR;  Surgeon: Leanora Cover, MD;  Location: [place];  Service: Orthopedics;  Laterality: Right;    History reviewed. No pertinent family history.  Social History   Socioeconomic History   Marital status: Single    Spouse name: Not on file   Number of children: Not on file   Years of education: Not on file   Highest education level: Not on file  Occupational History   Not on file  Tobacco Use   Smoking status: Never   Smokeless tobacco: Never  Vaping Use   Vaping Use: Never used  Substance and Sexual Activity   Alcohol use: No   Drug use: Not Currently     Types: Marijuana   Sexual activity: Not on file  Other Topics Concern   Not on file  Social History Narrative   Not on file   Social Determinants of Health   Financial Resource Strain: Not on file  Food Insecurity: Not on file  Transportation Needs: Not on file  Physical Activity: Not on file  Stress: Not on file  Social Connections: Not on file  Intimate Partner Violence: Not on file    Outpatient Medications Prior to Visit  Medication Sig Dispense Refill   albuterol (VENTOLIN HFA) 108 (90 Base) MCG/ACT inhaler Inhale 2 puffs into the lungs every 6 (six) hours as needed for wheezing or shortness of breath. 18 g 5   benztropine (COGENTIN) 0.5 MG tablet Take 1 tablet (0.5 mg total) by mouth 2 (two) times daily. For prevention of drug induced tremors 60 tablet 0   Cholecalciferol (VITAMIN D-3) 125 MCG (5000 UT) TABS Take 125 mcg by mouth daily.     docusate sodium (COLACE) 100 MG capsule Take 1 capsule (100 mg total) by mouth daily as needed for mild constipation. 30 capsule 5   hydrocortisone (ANUSOL-HC) 2.5 % rectal cream Place 1 application rectally 2 (two) times daily. 30 g 0   hydrOXYzine (ATARAX/VISTARIL) 25 MG tablet Take 1 tablet (25 mg total) by mouth 3 (three) times daily as needed  for anxiety. 60 tablet 0   omega-3 acid ethyl esters (LOVAZA) 1 g capsule Take 1 capsule (1 g total) by mouth 2 (two) times daily. For high cholesterol 60 capsule 0   risperiDONE (RISPERDAL M-TABS) 3 MG disintegrating tablet Take 1 tablet (3 mg total) by mouth 2 (two) times daily. For mood control 60 tablet 0   traZODone (DESYREL) 50 MG tablet Take 1 tablet (50 mg total) by mouth at bedtime as needed for sleep. 30 tablet 0   No facility-administered medications prior to visit.    No Known Allergies  ROS Review of Systems  Constitutional:  Negative for chills and fever.  HENT:  Negative for congestion, sinus pressure, sinus pain and sore throat.   Eyes:  Negative for pain and discharge.   Respiratory:  Negative for cough and shortness of breath.   Cardiovascular:  Negative for chest pain and palpitations.  Gastrointestinal:  Negative for diarrhea, nausea and vomiting.  Endocrine: Negative for polydipsia and polyuria.  Genitourinary:  Negative for dysuria and hematuria.  Musculoskeletal:  Negative for neck pain and neck stiffness.       Right thumb pain  Skin:  Negative for rash.  Neurological:  Negative for dizziness, weakness, numbness and headaches.  Psychiatric/Behavioral:  Negative for agitation and behavioral problems.       Objective:    Physical Exam Vitals reviewed.  Constitutional:      General: He is not in acute distress.    Appearance: He is obese. He is not diaphoretic.  HENT:     Head: Normocephalic and atraumatic.     Nose: Nose normal.     Mouth/Throat:     Mouth: Mucous membranes are moist.  Eyes:     General: No scleral icterus.    Extraocular Movements: Extraocular movements intact.  Cardiovascular:     Rate and Rhythm: Normal rate and regular rhythm.     Pulses: Normal pulses.     Heart sounds: Normal heart sounds. No murmur heard. Pulmonary:     Breath sounds: Normal breath sounds. No wheezing or rales.  Abdominal:     Palpations: Abdomen is soft.     Tenderness: There is no abdominal tenderness.  Musculoskeletal:     Cervical back: Neck supple. No tenderness.     Right lower leg: No edema.     Left lower leg: No edema.  Skin:    General: Skin is warm.     Findings: No rash.  Neurological:     General: No focal deficit present.     Mental Status: He is alert and oriented to person, place, and time.     Sensory: No sensory deficit.     Motor: No weakness.  Psychiatric:        Mood and Affect: Mood normal.        Thought Content: Thought content normal.     BP 122/84 (BP Location: Right Arm, Patient Position: Sitting, Cuff Size: Normal)   Pulse 60   Resp 18   Ht _0  (1.753 m)   Wt 236 lb 6.4 oz (107.2 kg)   SpO2 96%    BMI 34.91 kg/m  Wt Readings from Last 3 Encounters:  07/23/22 236 lb 6.4 oz (107.2 kg)  01/23/22 240 lb 1.3 oz (108.9 kg)  04/18/21 229 lb 1.9 oz (103.9 kg)    Lab Results  Component Value Date   TSH 1.740 10/14/2020   Lab Results  Component Value Date   WBC 7.9 01/22/2022  HGB 14.2 01/22/2022   HCT 42.4 01/22/2022   MCV 82 01/22/2022   PLT 336 01/22/2022   Lab Results  Component Value Date   NA 139 01/22/2022   K 4.7 01/22/2022   CO2 23 01/22/2022   GLUCOSE 95 01/22/2022   BUN 10 01/22/2022   CREATININE 0.98 01/22/2022   BILITOT 0.5 01/22/2022   ALKPHOS 85 01/22/2022   AST 24 01/22/2022   ALT 31 01/22/2022   PROT 7.4 01/22/2022   ALBUMIN 4.4 01/22/2022   CALCIUM 9.6 01/22/2022   ANIONGAP 9 12/01/2018   EGFR 110 01/22/2022   Lab Results  Component Value Date   CHOL 155 01/22/2022   Lab Results  Component Value Date   HDL 36 (L) 01/22/2022   Lab Results  Component Value Date   LDLCALC 90 01/22/2022   Lab Results  Component Value Date   TRIG 168 (H) 01/22/2022   Lab Results  Component Value Date   CHOLHDL 4.3 01/22/2022   Lab Results  Component Value Date   HGBA1C 6.0 07/23/2022   HGBA1C 6.0 07/23/2022   HGBA1C 6.0 07/23/2022      Assessment & Plan:   Problem List Items Addressed This Visit       Other   MDD (major depressive disorder), recurrent, severe, with psychosis ([place])    Follows up with Psychiatrist On Trazodone and Atarax with Risperidone and Benztropin (for schizophrenia)      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      Prediabetes    Lab Results  Component Value Date   HGBA1C 6.0 07/23/2022   HGBA1C 6.0 07/23/2022   HGBA1C 6.0 07/23/2022  Advised to follow low carb and low cholesterol diet      Relevant Orders   POCT glycosylated hemoglobin (Hb A1C) (Completed)   Mixed  hyperlipidemia    Lipid profile reviewed Advised to follow low cholesterol diet and perform moderate exercise/walking at least 150 mins/week      Obesity (BMI 30-39.9)    Some of psychiatric medications might be contributing to weight gain Has done well since last visit, lost 4 lbs with exercise/walking Advised for diet modification and moderate exercise/walking      Chronic pain of right thumb    Due to previous hand injury causing right thumb collateral ligament rupture, s/p repair and OT Naproxen as needed Referred to hand surgery      Relevant Medications   naproxen (NAPROSYN) 500 MG tablet   Other Relevant Orders   Ambulatory referral to Orthopedic Surgery    Meds ordered this encounter  Medications   naproxen (NAPROSYN) 500 MG tablet    Sig: Take 1 tablet (500 mg total) by mouth 2 (two) times daily with a meal.    Dispense:  30 tablet    Refill:  1    Follow-up: Return in about 6 months (around 01/23/2023) for Annual physical.    Lindell Spar, MD

## 2022-07-23 NOTE — Assessment & Plan Note (Signed)
Follows up with Psychiatrist On Trazodone and Atarax with Risperidone and Benztropin (for schizophrenia)

## 2022-07-23 NOTE — Assessment & Plan Note (Signed)
Due to previous hand injury causing right thumb collateral ligament rupture, s/p repair and OT Naproxen as needed Referred to hand surgery 

## 2022-07-23 NOTE — Assessment & Plan Note (Signed)
Follows up with Dr Geanie Cooley On Risperidone and Benztropine Patient is functional in day-to-day activities, lives with his grandmother.

## 2022-07-23 NOTE — Patient Instructions (Addendum)
Please take Naproxen for thumb pain. Okay to apply ice for pain or swelling.  Please continue to follow low carb diet and perform moderate exercise/walking at least 150 mins/week.

## 2022-07-23 NOTE — Assessment & Plan Note (Signed)
Lipid profile reviewed Advised to follow low cholesterol diet and perform moderate exercise/walking at least 150 mins/week 

## 2022-07-23 NOTE — Assessment & Plan Note (Signed)
Lab Results  Component Value Date   HGBA1C 6.0 07/23/2022   HGBA1C 6.0 07/23/2022   HGBA1C 6.0 07/23/2022   Advised to follow low carb and low cholesterol diet 

## 2022-07-23 NOTE — Assessment & Plan Note (Signed)
Some of psychiatric medications might be contributing to weight gain Has done well since last visit, lost 4 lbs with exercise/walking Advised for diet modification and moderate exercise/walking

## 2022-08-16 ENCOUNTER — Encounter: Payer: Self-pay | Admitting: Internal Medicine

## 2023-01-24 ENCOUNTER — Ambulatory Visit (INDEPENDENT_AMBULATORY_CARE_PROVIDER_SITE_OTHER): Payer: BC Managed Care – PPO | Admitting: Internal Medicine

## 2023-01-24 ENCOUNTER — Encounter: Payer: Self-pay | Admitting: Internal Medicine

## 2023-01-24 VITALS — BP 121/80 | HR 77 | Ht 69.0 in | Wt 258.2 lb

## 2023-01-24 DIAGNOSIS — F333 Major depressive disorder, recurrent, severe with psychotic symptoms: Secondary | ICD-10-CM | POA: Diagnosis not present

## 2023-01-24 DIAGNOSIS — Z0001 Encounter for general adult medical examination with abnormal findings: Secondary | ICD-10-CM

## 2023-01-24 DIAGNOSIS — Z23 Encounter for immunization: Secondary | ICD-10-CM

## 2023-01-24 DIAGNOSIS — M79644 Pain in right finger(s): Secondary | ICD-10-CM | POA: Diagnosis not present

## 2023-01-24 DIAGNOSIS — F203 Undifferentiated schizophrenia: Secondary | ICD-10-CM | POA: Diagnosis not present

## 2023-01-24 DIAGNOSIS — E782 Mixed hyperlipidemia: Secondary | ICD-10-CM

## 2023-01-24 DIAGNOSIS — R7303 Prediabetes: Secondary | ICD-10-CM

## 2023-01-24 DIAGNOSIS — G8929 Other chronic pain: Secondary | ICD-10-CM

## 2023-01-24 DIAGNOSIS — E669 Obesity, unspecified: Secondary | ICD-10-CM

## 2023-01-24 DIAGNOSIS — Z6838 Body mass index (BMI) 38.0-38.9, adult: Secondary | ICD-10-CM

## 2023-01-24 DIAGNOSIS — E559 Vitamin D deficiency, unspecified: Secondary | ICD-10-CM

## 2023-01-24 NOTE — Patient Instructions (Signed)
Please contact Dr Clovis Pu office to discuss about your medicines and current symptoms.  Please continue taking medications as prescribed.  Please continue to follow low carb diet and perform moderate exercise/walking at least 150 mins/week.

## 2023-01-24 NOTE — Assessment & Plan Note (Signed)
Physical exam as documented. Flu vaccine given today. Fasting blood test reviewed and discussed with the patient in detail.

## 2023-01-24 NOTE — Assessment & Plan Note (Addendum)
Uncontrolled Follows up with Psychiatrist On Trazodone and Atarax with Risperidone and Benztropin (for schizophrenia)

## 2023-01-24 NOTE — Assessment & Plan Note (Addendum)
Uncontrolled Follows up with Dr Hoyle Barr - advised to discuss with him about current symptoms On Risperidone and Benztropine Patient is functional in day-to-day activities, lives with his grandmother.

## 2023-01-24 NOTE — Assessment & Plan Note (Signed)
Lab Results  Component Value Date   HGBA1C 6.0 07/23/2022   HGBA1C 6.0 07/23/2022   HGBA1C 6.0 07/23/2022   Advised to follow low carb and low cholesterol diet

## 2023-01-24 NOTE — Progress Notes (Signed)
Established Patient Office Visit  Subjective:  Patient ID: Bobby [name], male    DOB: Apr 06, 1996  Age: 27 y.o. MRN: [id number]  CC:  Chief Complaint  Patient presents with   Follow-up    HPI Bobby [name] is a 27 y.o. male with past medical history of schizophrenia and mild, intermittent asthma who presents for annual physical.  Prediabetes: His HbA1c is stable at 6.0 now.  He admits that he has fried food almost every day and will try to cut it down.    Schizophrenia: Patient sees Dr Hoyle Barr for his schizophrenia and has not been well with his medications.  He reports apathy, anxiety, chronic fatigue and insomnia. Patient's grandmother has come to office today. He is overall independent with his ADLs.  He complains of right thumb base pain since 2016, when he had injury while playing basketball.  He had right thumb radial collateral ligament repair and had OT after it.  He still complains of chronic, sharp thumb pain.  Denies any numbness or tingling of the hand.    Past Medical History:  Diagnosis Date   Asthma    prn inhaler   Rupture of radial collateral ligament of right thumb 11/2015    Past Surgical History:  Procedure Laterality Date   LIGAMENT REPAIR Right 12/15/2015   Procedure: RIGHT THUMB RADIAL COLLATERAL LIGAMENT REPAIR;  Surgeon: Leanora Cover, MD;  Location: [place];  Service: Orthopedics;  Laterality: Right;    History reviewed. No pertinent family history.  Social History   Socioeconomic History   Marital status: Single    Spouse name: Not on file   Number of children: Not on file   Years of education: Not on file   Highest education level: Not on file  Occupational History   Not on file  Tobacco Use   Smoking status: Never   Smokeless tobacco: Never  Vaping Use   Vaping Use: Never used  Substance and Sexual Activity   Alcohol use: No   Drug use: Not Currently    Types: Marijuana   Sexual activity: Not on file  Other Topics  Concern   Not on file  Social History Narrative   ** Merged History Encounter **       Social Determinants of Health   Financial Resource Strain: Not on file  Food Insecurity: Not on file  Transportation Needs: Not on file  Physical Activity: Not on file  Stress: Not on file  Social Connections: Not on file  Intimate Partner Violence: Not on file    Outpatient Medications Prior to Visit  Medication Sig Dispense Refill   albuterol (VENTOLIN HFA) 108 (90 Base) MCG/ACT inhaler Inhale 2 puffs into the lungs every 6 (six) hours as needed for wheezing or shortness of breath. 18 g 5   benztropine (COGENTIN) 0.5 MG tablet Take 1 tablet (0.5 mg total) by mouth 2 (two) times daily. For prevention of drug induced tremors 60 tablet 0   Cholecalciferol (VITAMIN D-3) 125 MCG (5000 UT) TABS Take 125 mcg by mouth daily.     docusate sodium (COLACE) 100 MG capsule Take 1 capsule (100 mg total) by mouth daily as needed for mild constipation. 30 capsule 5   hydrocortisone (ANUSOL-HC) 2.5 % rectal cream Place 1 application rectally 2 (two) times daily. 30 g 0   hydrOXYzine (ATARAX/VISTARIL) 25 MG tablet Take 1 tablet (25 mg total) by mouth 3 (three) times daily as needed for anxiety. 60 tablet 0  naproxen (NAPROSYN) 500 MG tablet Take 1 tablet (500 mg total) by mouth 2 (two) times daily with a meal. 30 tablet 1   omega-3 acid ethyl esters (LOVAZA) 1 g capsule Take 1 capsule (1 g total) by mouth 2 (two) times daily. For high cholesterol 60 capsule 0   risperiDONE (RISPERDAL M-TABS) 3 MG disintegrating tablet Take 1 tablet (3 mg total) by mouth 2 (two) times daily. For mood control 60 tablet 0   traZODone (DESYREL) 50 MG tablet Take 1 tablet (50 mg total) by mouth at bedtime as needed for sleep. 30 tablet 0   No facility-administered medications prior to visit.    No Known Allergies  ROS Review of Systems  Constitutional:  Negative for chills and fever.  HENT:  Negative for congestion, sinus  pressure, sinus pain and sore throat.   Eyes:  Negative for pain and discharge.  Respiratory:  Negative for cough and shortness of breath.   Cardiovascular:  Negative for chest pain and palpitations.  Gastrointestinal:  Negative for diarrhea, nausea and vomiting.  Endocrine: Negative for polydipsia and polyuria.  Genitourinary:  Negative for dysuria and hematuria.  Musculoskeletal:  Negative for neck pain and neck stiffness.       Right thumb pain  Skin:  Negative for rash.  Neurological:  Negative for dizziness, weakness, numbness and headaches.  Psychiatric/Behavioral:  Negative for agitation and behavioral problems.       Objective:    Physical Exam Vitals reviewed.  Constitutional:      General: He is not in acute distress.    Appearance: He is obese. He is not diaphoretic.  HENT:     Head: Normocephalic and atraumatic.     Nose: Nose normal.     Mouth/Throat:     Mouth: Mucous membranes are moist.  Eyes:     General: No scleral icterus.    Extraocular Movements: Extraocular movements intact.  Cardiovascular:     Rate and Rhythm: Normal rate and regular rhythm.     Pulses: Normal pulses.     Heart sounds: Normal heart sounds. No murmur heard. Pulmonary:     Breath sounds: Normal breath sounds. No wheezing or rales.  Abdominal:     Palpations: Abdomen is soft.     Tenderness: There is no abdominal tenderness.  Musculoskeletal:     Cervical back: Neck supple. No tenderness.     Right lower leg: No edema.     Left lower leg: No edema.  Skin:    General: Skin is warm.     Findings: No rash.  Neurological:     General: No focal deficit present.     Mental Status: He is alert and oriented to person, place, and time.     Sensory: No sensory deficit.     Motor: No weakness.  Psychiatric:        Mood and Affect: Mood normal.        Thought Content: Thought content normal.     BP 121/80 (BP Location: Right Arm, Patient Position: Sitting, Cuff Size: Large)   Pulse  77   Ht 5\' 9"  (1.753 m)   Wt 258 lb 3.2 oz (117.1 kg)   SpO2 95%   BMI 38.13 kg/m  Wt Readings from Last 3 Encounters:  01/24/23 258 lb 3.2 oz (117.1 kg)  07/23/22 236 lb 6.4 oz (107.2 kg)  01/23/22 240 lb 1.3 oz (108.9 kg)    Lab Results  Component Value Date   TSH 1.740 10/14/2020  Lab Results  Component Value Date   WBC 7.9 01/22/2022   HGB 14.2 01/22/2022   HCT 42.4 01/22/2022   MCV 82 01/22/2022   PLT 336 01/22/2022   Lab Results  Component Value Date   NA 139 01/22/2022   K 4.7 01/22/2022   CO2 23 01/22/2022   GLUCOSE 95 01/22/2022   BUN 10 01/22/2022   CREATININE 0.98 01/22/2022   BILITOT 0.5 01/22/2022   ALKPHOS 85 01/22/2022   AST 24 01/22/2022   ALT 31 01/22/2022   PROT 7.4 01/22/2022   ALBUMIN 4.4 01/22/2022   CALCIUM 9.6 01/22/2022   ANIONGAP 9 12/01/2018   EGFR 110 01/22/2022   Lab Results  Component Value Date   CHOL 155 01/22/2022   Lab Results  Component Value Date   HDL 36 (L) 01/22/2022   Lab Results  Component Value Date   LDLCALC 90 01/22/2022   Lab Results  Component Value Date   TRIG 168 (H) 01/22/2022   Lab Results  Component Value Date   CHOLHDL 4.3 01/22/2022   Lab Results  Component Value Date   HGBA1C 6.0 07/23/2022   HGBA1C 6.0 07/23/2022   HGBA1C 6.0 07/23/2022      Assessment & Plan:   Problem List Items Addressed This Visit       Other   MDD (major depressive disorder), recurrent, severe, with psychosis ([place])    Uncontrolled Follows up with Psychiatrist On Trazodone and Atarax with Risperidone and Benztropin (for schizophrenia)      Relevant Orders   TSH   Undifferentiated schizophrenia ([place])    Uncontrolled Follows up with Dr Hoyle Barr - advised to discuss with him about current symptoms On Risperidone and Benztropine Patient is functional in day-to-day activities, lives with his grandmother.      Relevant Orders   TSH   Prediabetes    Lab Results  Component Value Date   HGBA1C 6.0 07/23/2022    HGBA1C 6.0 07/23/2022   HGBA1C 6.0 07/23/2022  Advised to follow low carb and low cholesterol diet      Relevant Orders   Hemoglobin A1c   CMP14+EGFR   Mixed hyperlipidemia    Lipid profile reviewed Advised to follow low cholesterol diet and perform moderate exercise/walking at least 150 mins/week      Relevant Orders   Lipid panel   Obesity (BMI 30-39.9)    Some of psychiatric medications might be contributing to weight gain Advised for diet modification and moderate exercise/walking      Encounter for general adult medical examination with abnormal findings - Primary    Physical exam as documented. Flu vaccine given today. Fasting blood test reviewed and discussed with the patient in detail.      Relevant Orders   TSH   Lipid panel   CMP14+EGFR   CBC with Differential/Platelet   Chronic pain of right thumb    Due to previous hand injury causing right thumb collateral ligament rupture, s/p repair and OT Naproxen as needed Referred to hand surgery      Relevant Orders   Ambulatory referral to Orthopedic Surgery   Other Visit Diagnoses     Need for immunization against influenza       Relevant Orders   Flu Vaccine QUAD 63mo+IM (Fluarix, Fluzone & Alfiuria Quad PF) (Completed)   Vitamin D deficiency       Relevant Orders   VITAMIN D 25 Hydroxy (Vit-D Deficiency, Fractures)       No orders of the defined types  were placed in this encounter.   Follow-up: Return in about 6 months (around 07/25/2023) for Prediabetes.    Lindell Spar, MD

## 2023-01-25 LAB — CBC WITH DIFFERENTIAL/PLATELET
Basophils Absolute: 0.1 10*3/uL (ref 0.0–0.2)
Basos: 1 %
EOS (ABSOLUTE): 0.3 10*3/uL (ref 0.0–0.4)
Eos: 4 %
Hematocrit: 42.6 % (ref 37.5–51.0)
Hemoglobin: 14.1 g/dL (ref 13.0–17.7)
Immature Grans (Abs): 0 10*3/uL (ref 0.0–0.1)
Immature Granulocytes: 0 %
Lymphocytes Absolute: 2.1 10*3/uL (ref 0.7–3.1)
Lymphs: 30 %
MCH: 26.9 pg (ref 26.6–33.0)
MCHC: 33.1 g/dL (ref 31.5–35.7)
MCV: 81 fL (ref 79–97)
Monocytes Absolute: 0.6 10*3/uL (ref 0.1–0.9)
Monocytes: 9 %
Neutrophils Absolute: 3.9 10*3/uL (ref 1.4–7.0)
Neutrophils: 56 %
Platelets: 363 10*3/uL (ref 150–450)
RBC: 5.24 x10E6/uL (ref 4.14–5.80)
RDW: 13.1 % (ref 11.6–15.4)
WBC: 7 10*3/uL (ref 3.4–10.8)

## 2023-01-25 LAB — VITAMIN D 25 HYDROXY (VIT D DEFICIENCY, FRACTURES): Vit D, 25-Hydroxy: 29.2 ng/mL — ABNORMAL LOW (ref 30.0–100.0)

## 2023-01-25 LAB — CMP14+EGFR
ALT: 23 IU/L (ref 0–44)
AST: 20 IU/L (ref 0–40)
Albumin/Globulin Ratio: 1.5 (ref 1.2–2.2)
Albumin: 4.5 g/dL (ref 4.3–5.2)
Alkaline Phosphatase: 76 IU/L (ref 44–121)
BUN/Creatinine Ratio: 10 (ref 9–20)
BUN: 10 mg/dL (ref 6–20)
Bilirubin Total: 0.3 mg/dL (ref 0.0–1.2)
CO2: 22 mmol/L (ref 20–29)
Calcium: 10.2 mg/dL (ref 8.7–10.2)
Chloride: 99 mmol/L (ref 96–106)
Creatinine, Ser: 1.02 mg/dL (ref 0.76–1.27)
Globulin, Total: 3.1 g/dL (ref 1.5–4.5)
Glucose: 98 mg/dL (ref 70–99)
Potassium: 4.6 mmol/L (ref 3.5–5.2)
Sodium: 138 mmol/L (ref 134–144)
Total Protein: 7.6 g/dL (ref 6.0–8.5)
eGFR: 105 mL/min/{1.73_m2} (ref >59)

## 2023-01-25 LAB — HEMOGLOBIN A1C
Est. average glucose Bld gHb Est-mCnc: 134 mg/dL
Hgb A1c MFr Bld: 6.3 % — ABNORMAL HIGH (ref 4.8–5.6)

## 2023-01-25 LAB — LIPID PANEL
Chol/HDL Ratio: 5.5 ratio — ABNORMAL HIGH (ref 0.0–5.0)
Cholesterol, Total: 186 mg/dL (ref 100–199)
HDL: 34 mg/dL — ABNORMAL LOW (ref >39)
LDL Chol Calc (NIH): 117 mg/dL — ABNORMAL HIGH (ref 0–99)
Triglycerides: 196 mg/dL — ABNORMAL HIGH (ref 0–149)
VLDL Cholesterol Cal: 35 mg/dL (ref 5–40)

## 2023-01-25 LAB — TSH: TSH: 1.47 u[IU]/mL (ref 0.450–4.500)

## 2023-01-25 NOTE — Assessment & Plan Note (Signed)
Some of psychiatric medications might be contributing to weight gain Advised for diet modification and moderate exercise/walking

## 2023-01-25 NOTE — Assessment & Plan Note (Signed)
Due to previous hand injury causing right thumb collateral ligament rupture, s/p repair and OT Naproxen as needed Referred to hand surgery

## 2023-01-25 NOTE — Assessment & Plan Note (Signed)
Lipid profile reviewed Advised to follow low cholesterol diet and perform moderate exercise/walking at least 150 mins/week

## 2023-07-30 ENCOUNTER — Ambulatory Visit (INDEPENDENT_AMBULATORY_CARE_PROVIDER_SITE_OTHER): Payer: BC Managed Care – PPO | Admitting: Internal Medicine

## 2023-07-30 ENCOUNTER — Encounter: Payer: Self-pay | Admitting: Internal Medicine

## 2023-07-30 VITALS — BP 129/80 | HR 69 | Ht 69.0 in | Wt 250.0 lb

## 2023-07-30 DIAGNOSIS — F333 Major depressive disorder, recurrent, severe with psychotic symptoms: Secondary | ICD-10-CM

## 2023-07-30 DIAGNOSIS — R7303 Prediabetes: Secondary | ICD-10-CM | POA: Diagnosis not present

## 2023-07-30 DIAGNOSIS — M79644 Pain in right finger(s): Secondary | ICD-10-CM | POA: Diagnosis not present

## 2023-07-30 DIAGNOSIS — F203 Undifferentiated schizophrenia: Secondary | ICD-10-CM

## 2023-07-30 DIAGNOSIS — E782 Mixed hyperlipidemia: Secondary | ICD-10-CM

## 2023-07-30 DIAGNOSIS — G8929 Other chronic pain: Secondary | ICD-10-CM

## 2023-07-30 LAB — POCT GLYCOSYLATED HEMOGLOBIN (HGB A1C): HbA1c, POC (controlled diabetic range): 5.9 % (ref 0.0–7.0)

## 2023-07-30 NOTE — Patient Instructions (Signed)
Please continue to take medications as prescribed.  Please continue to follow low carb diet and perform moderate exercise/walking at least 150 mins/week.  You are being referred to Hand specialist: Dr Frazier Butt [address] [place], [place], [address] [phone]

## 2023-07-30 NOTE — Assessment & Plan Note (Signed)
Lab Results  Component Value Date   HGBA1C 6.3 (H) 01/24/2023   Advised to follow low carb and low cholesterol diet

## 2023-07-30 NOTE — Assessment & Plan Note (Addendum)
Uncontrolled Follows up with Psychiatrist On Trazodone and Atarax with Risperidone and Benztropin (for schizophrenia) - needs to be compliant

## 2023-07-30 NOTE — Assessment & Plan Note (Signed)
Lipid profile reviewed Advised to follow low cholesterol diet and perform moderate exercise/walking at least 150 mins/week 

## 2023-07-30 NOTE — Assessment & Plan Note (Signed)
Uncontrolled Follows up with Dr Hoyle Barr - advised to discuss with him about current symptoms On Risperidone and Benztropine Patient is functional in day-to-day activities, lives with his grandmother.

## 2023-07-30 NOTE — Progress Notes (Signed)
Established Patient Office Visit  Subjective:  Patient ID: Bobby [name], male    DOB: [birth year]  Age: 27 y.o. MRN: [id number]  CC:  Chief Complaint  Patient presents with   Prediabetes    Six month follow up     HPI Bobby [name] is a 27 y.o. male with past medical history of schizophrenia and mild, intermittent asthma who presents for f/u of his chronic medical conditions.  Prediabetes: His HbA1c is stable at 5.9 now.  He admits that he had fried food almost every day and has tried to cut it down.   Schizophrenia: Patient sees Dr Geanie Cooley for his schizophrenia and has not been well with his medications, but he has poor compliance.  He reports improvement in apathy, anxiety, chronic fatigue and insomnia. He is overall independent with his ADLs.  He complains of right thumb base pain since 2016, when he had injury while playing basketball.  He had right thumb radial collateral ligament repair and had OT after it.  He still complains of chronic, sharp thumb pain.  Denies any numbness or tingling of the hand.  He has not been able to see hand surgeon yet since the last visit.   Past Medical History:  Diagnosis Date   Asthma    prn inhaler   Rupture of radial collateral ligament of right thumb 11/2015    Past Surgical History:  Procedure Laterality Date   LIGAMENT REPAIR Right 12/15/2015   Procedure: RIGHT THUMB RADIAL COLLATERAL LIGAMENT REPAIR;  Surgeon: Betha Loa, MD;  Location: [facility];  Service: Orthopedics;  Laterality: Right;    History reviewed. No pertinent family history.  Social History   Socioeconomic History   Marital status: Single    Spouse name: Not on file   Number of children: Not on file   Years of education: Not on file   Highest education level: Not on file  Occupational History   Not on file  Tobacco Use   Smoking status: Never   Smokeless tobacco: Never  Vaping Use   Vaping status: Never Used  Substance and Sexual Activity    Alcohol use: No   Drug use: Not Currently    Types: Marijuana   Sexual activity: Not on file  Other Topics Concern   Not on file  Social History Narrative   ** Merged History Encounter **       Social Determinants of Health   Financial Resource Strain: Not on file  Food Insecurity: Not on file  Transportation Needs: Not on file  Physical Activity: Not on file  Stress: Not on file  Social Connections: Not on file  Intimate Partner Violence: Not on file    Outpatient Medications Prior to Visit  Medication Sig Dispense Refill   albuterol (VENTOLIN HFA) 108 (90 Base) MCG/ACT inhaler Inhale 2 puffs into the lungs every 6 (six) hours as needed for wheezing or shortness of breath. 18 g 5   benztropine (COGENTIN) 0.5 MG tablet Take 1 tablet (0.5 mg total) by mouth 2 (two) times daily. For prevention of drug induced tremors 60 tablet 0   Cholecalciferol (VITAMIN D-3) 125 MCG (5000 UT) TABS Take 125 mcg by mouth daily.     docusate sodium (COLACE) 100 MG capsule Take 1 capsule (100 mg total) by mouth daily as needed for mild constipation. 30 capsule 5   hydrocortisone (ANUSOL-HC) 2.5 % rectal cream Place 1 application rectally 2 (two) times daily. 30 g 0   hydrOXYzine (  ATARAX/VISTARIL) 25 MG tablet Take 1 tablet (25 mg total) by mouth 3 (three) times daily as needed for anxiety. 60 tablet 0   naproxen (NAPROSYN) 500 MG tablet Take 1 tablet (500 mg total) by mouth 2 (two) times daily with a meal. 30 tablet 1   omega-3 acid ethyl esters (LOVAZA) 1 g capsule Take 1 capsule (1 g total) by mouth 2 (two) times daily. For high cholesterol 60 capsule 0   risperiDONE (RISPERDAL M-TABS) 3 MG disintegrating tablet Take 1 tablet (3 mg total) by mouth 2 (two) times daily. For mood control 60 tablet 0   traZODone (DESYREL) 50 MG tablet Take 1 tablet (50 mg total) by mouth at bedtime as needed for sleep. 30 tablet 0   No facility-administered medications prior to visit.    No Known  Allergies  ROS Review of Systems  Constitutional:  Negative for chills and fever.  HENT:  Negative for congestion, sinus pressure, sinus pain and sore throat.   Eyes:  Negative for pain and discharge.  Respiratory:  Negative for cough and shortness of breath.   Cardiovascular:  Negative for chest pain and palpitations.  Gastrointestinal:  Negative for diarrhea, nausea and vomiting.  Endocrine: Negative for polydipsia and polyuria.  Genitourinary:  Negative for dysuria and hematuria.  Musculoskeletal:  Negative for neck pain and neck stiffness.       Right thumb pain  Skin:  Negative for rash.  Neurological:  Negative for dizziness, weakness, numbness and headaches.  Psychiatric/Behavioral:  Negative for agitation and behavioral problems.       Objective:    Physical Exam Vitals reviewed.  Constitutional:      General: He is not in acute distress.    Appearance: He is obese. He is not diaphoretic.  HENT:     Head: Normocephalic and atraumatic.     Nose: Nose normal.     Mouth/Throat:     Mouth: Mucous membranes are moist.  Eyes:     General: No scleral icterus.    Extraocular Movements: Extraocular movements intact.  Cardiovascular:     Rate and Rhythm: Normal rate and regular rhythm.     Pulses: Normal pulses.     Heart sounds: Normal heart sounds. No murmur heard. Pulmonary:     Breath sounds: Normal breath sounds. No wheezing or rales.  Musculoskeletal:     Cervical back: Neck supple. No tenderness.     Right lower leg: No edema.     Left lower leg: No edema.  Skin:    General: Skin is warm.     Findings: No rash.  Neurological:     General: No focal deficit present.     Mental Status: He is alert and oriented to person, place, and time.     Sensory: No sensory deficit.     Motor: No weakness.  Psychiatric:        Mood and Affect: Mood normal.        Thought Content: Thought content normal.     BP 129/80 (BP Location: Right Arm, Patient Position: Sitting,  Cuff Size: Large)   Pulse 69   Ht 5\' 9"  (1.753 m)   Wt 250 lb (113.4 kg)   SpO2 96%   BMI 36.92 kg/m  Wt Readings from Last 3 Encounters:  07/30/23 250 lb (113.4 kg)  01/24/23 258 lb 3.2 oz (117.1 kg)  07/23/22 236 lb 6.4 oz (107.2 kg)    Lab Results  Component Value Date   TSH 1.470  01/24/2023   Lab Results  Component Value Date   WBC 7.0 01/24/2023   HGB 14.1 01/24/2023   HCT 42.6 01/24/2023   MCV 81 01/24/2023   PLT 363 01/24/2023   Lab Results  Component Value Date   NA 138 01/24/2023   K 4.6 01/24/2023   CO2 22 01/24/2023   GLUCOSE 98 01/24/2023   BUN 10 01/24/2023   CREATININE 1.02 01/24/2023   BILITOT 0.3 01/24/2023   ALKPHOS 76 01/24/2023   AST 20 01/24/2023   ALT 23 01/24/2023   PROT 7.6 01/24/2023   ALBUMIN 4.5 01/24/2023   CALCIUM 10.2 01/24/2023   ANIONGAP 9 12/01/2018   EGFR 105 01/24/2023   Lab Results  Component Value Date   CHOL 186 01/24/2023   Lab Results  Component Value Date   HDL 34 (L) 01/24/2023   Lab Results  Component Value Date   LDLCALC 117 (H) 01/24/2023   Lab Results  Component Value Date   TRIG 196 (H) 01/24/2023   Lab Results  Component Value Date   CHOLHDL 5.5 (H) 01/24/2023   Lab Results  Component Value Date   HGBA1C 5.9 07/30/2023      Assessment & Plan:   Problem List Items Addressed This Visit       Other   MDD (major depressive disorder), recurrent, severe, with psychosis ([facility])    Uncontrolled Follows up with Psychiatrist On Trazodone and Atarax with Risperidone and Benztropin (for schizophrenia) - needs to be compliant      Undifferentiated schizophrenia ([facility]) - Primary    Uncontrolled Follows up with Dr Geanie Cooley - advised to discuss with him about current symptoms On Risperidone and Benztropine Patient is functional in day-to-day activities, lives with his grandmother.      Prediabetes    Lab Results  Component Value Date   HGBA1C 6.3 (H) 01/24/2023   Advised to follow low carb and low  cholesterol diet      Relevant Orders   POCT glycosylated hemoglobin (Hb A1C) (Completed)   Mixed hyperlipidemia    Lipid profile reviewed Advised to follow low cholesterol diet and perform moderate exercise/walking at least 150 mins/week      Chronic pain of right thumb    Due to previous hand injury causing right thumb collateral ligament rupture, s/p repair and OT Naproxen as needed Referred to hand surgery as he has persistent thumb pain      Relevant Orders   Ambulatory referral to Orthopedic Surgery    No orders of the defined types were placed in this encounter.   Follow-up: Return in about 6 months (around 01/30/2024) for Annual physical.    Anabel Halon, MD

## 2023-07-30 NOTE — Assessment & Plan Note (Signed)
Due to previous hand injury causing right thumb collateral ligament rupture, s/p repair and OT Naproxen as needed Referred to hand surgery as he has persistent thumb pain

## 2024-01-01 ENCOUNTER — Telehealth: Payer: Self-pay

## 2024-01-01 NOTE — Telephone Encounter (Signed)
 Spoke to patient and informed him that It is important to take his medications as perscribed

## 2024-01-01 NOTE — Telephone Encounter (Signed)
 Copied from CRM [id number]. Topic: Clinical - Medical Advice >> Jan 01, 2024  8:27 AM Adelina Mings wrote: Reason for CRM: Needs a call from nurse, needs wellness call, let pt know how important it is to take medicine and ask any questions

## 2024-01-30 ENCOUNTER — Ambulatory Visit: Payer: BC Managed Care – PPO | Admitting: Internal Medicine

## 2024-01-30 ENCOUNTER — Encounter: Payer: Self-pay | Admitting: Internal Medicine

## 2024-01-30 ENCOUNTER — Telehealth: Payer: Self-pay | Admitting: Internal Medicine

## 2024-01-30 VITALS — BP 127/65 | HR 71 | Ht 69.0 in | Wt 254.8 lb

## 2024-01-30 DIAGNOSIS — G8929 Other chronic pain: Secondary | ICD-10-CM

## 2024-01-30 DIAGNOSIS — M79644 Pain in right finger(s): Secondary | ICD-10-CM

## 2024-01-30 DIAGNOSIS — E559 Vitamin D deficiency, unspecified: Secondary | ICD-10-CM

## 2024-01-30 DIAGNOSIS — F333 Major depressive disorder, recurrent, severe with psychotic symptoms: Secondary | ICD-10-CM

## 2024-01-30 DIAGNOSIS — E669 Obesity, unspecified: Secondary | ICD-10-CM

## 2024-01-30 DIAGNOSIS — J4520 Mild intermittent asthma, uncomplicated: Secondary | ICD-10-CM

## 2024-01-30 DIAGNOSIS — F203 Undifferentiated schizophrenia: Secondary | ICD-10-CM

## 2024-01-30 DIAGNOSIS — E782 Mixed hyperlipidemia: Secondary | ICD-10-CM

## 2024-01-30 DIAGNOSIS — Z0001 Encounter for general adult medical examination with abnormal findings: Secondary | ICD-10-CM | POA: Diagnosis not present

## 2024-01-30 DIAGNOSIS — Z23 Encounter for immunization: Secondary | ICD-10-CM | POA: Diagnosis not present

## 2024-01-30 DIAGNOSIS — R7303 Prediabetes: Secondary | ICD-10-CM

## 2024-01-30 MED ORDER — ALBUTEROL SULFATE HFA 108 (90 BASE) MCG/ACT IN AERS: 2.0000 | INHALATION_SPRAY | Freq: Four times a day (QID) | RESPIRATORY_TRACT | 5 refills | Status: DC | PRN

## 2024-01-30 NOTE — Patient Instructions (Signed)
 Please take medications regularly as prescribed.  Please start taking Vitamin D  2000 IU once daily.  Please avoid alcohol in excess amounts.  Please continue to follow low carb diet and perform moderate exercise/walking at least 150 mins/week.

## 2024-01-30 NOTE — Assessment & Plan Note (Signed)
 Check lipid profile Advised to follow low cholesterol diet and perform moderate exercise/walking at least 150 mins/week

## 2024-01-30 NOTE — Telephone Encounter (Signed)
 Patient had an appointment the lady with him said he has ran out of his inhaler and needs refill.

## 2024-01-30 NOTE — Assessment & Plan Note (Addendum)
 BMI Readings from Last 3 Encounters:  01/30/24 37.63 kg/m  07/30/23 36.92 kg/m  01/24/23 38.13 kg/m   Some of psychiatric medications might be contributing to weight gain Advised for diet modification and moderate exercise/walking

## 2024-01-30 NOTE — Assessment & Plan Note (Signed)
Uncontrolled Follows up with Dr Hoyle Barr - advised to discuss with him about current symptoms On Risperidone and Benztropine Patient is functional in day-to-day activities, lives with his grandmother.

## 2024-01-30 NOTE — Assessment & Plan Note (Signed)
 Lab Results  Component Value Date   HGBA1C 5.9 07/30/2023   Advised to follow low carb and low cholesterol diet

## 2024-01-30 NOTE — Assessment & Plan Note (Addendum)
 Uncontrolled due to noncompliance, advised to remain compliant with medications and psychiatry follow-up Follows up with Psychiatrist On Trazodone  and Atarax  with Risperidone  and Benztropin (for schizophrenia) - needs to be compliant

## 2024-01-30 NOTE — Assessment & Plan Note (Signed)
 Due to previous hand injury causing right thumb collateral ligament rupture, s/p repair and OT Naproxen  as needed Referred to hand surgery as he has persistent thumb pain, but did not follow up

## 2024-01-30 NOTE — Telephone Encounter (Signed)
 Refill sent to pharmacy.

## 2024-01-30 NOTE — Assessment & Plan Note (Signed)
Well-controlled On Albuterol inhaler PRN 

## 2024-01-30 NOTE — Progress Notes (Signed)
 Established Patient Office Visit  Subjective:  Patient ID: Bobby [name], male    DOB: [birth year]  Age: 28 y.o. MRN: [id number]  CC:  Chief Complaint  Patient presents with   Annual Exam    Cpe and 6 month f/u    HPI Bobby [name] is a 28 y.o. male with past medical history of schizophrenia and mild, intermittent asthma who presents for annual physical.  Prediabetes: His HbA1c was stable at 5.9 in 08/24.  He admits that he had fried food almost every day and has tried to cut it down.   Schizophrenia: Patient sees Dr Karolynn for his schizophrenia and has not been doing well with his medications, but he has poor compliance.  He has apathy, anxiety and insomnia. He is overall independent with his ADLs.  He complains of right thumb base pain since 2016, when he had injury while playing basketball.  He had right thumb radial collateral ligament repair and had OT after it.  He still complains of chronic, sharp thumb pain.  Denies any numbness or tingling of the hand.  He has not been able to see hand surgeon yet since the last visit.   Past Medical History:  Diagnosis Date   Asthma    prn inhaler   Rupture of radial collateral ligament of right thumb 11/2015    Past Surgical History:  Procedure Laterality Date   LIGAMENT REPAIR Right 12/15/2015   Procedure: RIGHT THUMB RADIAL COLLATERAL LIGAMENT REPAIR;  Surgeon: Franky Curia, MD;  Location: [facility];  Service: Orthopedics;  Laterality: Right;    History reviewed. No pertinent family history.  Social History   Socioeconomic History   Marital status: Single    Spouse name: Not on file   Number of children: Not on file   Years of education: Not on file   Highest education level: Not on file  Occupational History   Not on file  Tobacco Use   Smoking status: Never   Smokeless tobacco: Never  Vaping Use   Vaping status: Never Used  Substance and Sexual Activity   Alcohol use: No   Drug use: Not Currently     Types: Marijuana   Sexual activity: Not on file  Other Topics Concern   Not on file  Social History Narrative   ** Merged History Encounter **       Social Drivers of Health   Financial Resource Strain: Not on file  Food Insecurity: Not on file  Transportation Needs: Not on file  Physical Activity: Not on file  Stress: Not on file  Social Connections: Not on file  Intimate Partner Violence: Not on file    Outpatient Medications Prior to Visit  Medication Sig Dispense Refill   benztropine  (COGENTIN ) 0.5 MG tablet Take 1 tablet (0.5 mg total) by mouth 2 (two) times daily. For prevention of drug induced tremors 60 tablet 0   Cholecalciferol  (VITAMIN D -3) 125 MCG (5000 UT) TABS Take 125 mcg by mouth daily.     hydrOXYzine  (ATARAX /VISTARIL ) 25 MG tablet Take 1 tablet (25 mg total) by mouth 3 (three) times daily as needed for anxiety. 60 tablet 0   risperiDONE  (RISPERDAL ) 3 MG tablet Take 3 mg by mouth 2 (two) times daily.     traZODone  (DESYREL ) 50 MG tablet Take 1 tablet (50 mg total) by mouth at bedtime as needed for sleep. 30 tablet 0   albuterol  (VENTOLIN  HFA) 108 (90 Base) MCG/ACT inhaler Inhale 2 puffs into the  lungs every 6 (six) hours as needed for wheezing or shortness of breath. 18 g 5   docusate sodium  (COLACE) 100 MG capsule Take 1 capsule (100 mg total) by mouth daily as needed for mild constipation. 30 capsule 5   naproxen  (NAPROSYN ) 500 MG tablet Take 1 tablet (500 mg total) by mouth 2 (two) times daily with a meal. 30 tablet 1   omega-3 acid ethyl esters (LOVAZA ) 1 g capsule Take 1 capsule (1 g total) by mouth 2 (two) times daily. For high cholesterol 60 capsule 0   risperiDONE  (RISPERDAL  M-TABS) 3 MG disintegrating tablet Take 1 tablet (3 mg total) by mouth 2 (two) times daily. For mood control 60 tablet 0   hydrocortisone  (ANUSOL -HC) 2.5 % rectal cream Place 1 application rectally 2 (two) times daily. (Patient not taking: Reported on 01/30/2024) 30 g 0   No  facility-administered medications prior to visit.    No Known Allergies  ROS Review of Systems  Constitutional:  Negative for chills and fever.  HENT:  Negative for congestion, sinus pressure, sinus pain and sore throat.   Eyes:  Negative for pain and discharge.  Respiratory:  Negative for cough and shortness of breath.   Cardiovascular:  Negative for chest pain and palpitations.  Gastrointestinal:  Negative for diarrhea, nausea and vomiting.  Endocrine: Negative for polydipsia and polyuria.  Genitourinary:  Negative for dysuria and hematuria.  Musculoskeletal:  Negative for neck pain and neck stiffness.       Right thumb pain  Skin:  Negative for rash.  Neurological:  Negative for dizziness, weakness, numbness and headaches.  Psychiatric/Behavioral:  Negative for agitation and behavioral problems.       Objective:    Physical Exam Vitals reviewed.  Constitutional:      General: He is not in acute distress.    Appearance: He is obese. He is not diaphoretic.  HENT:     Head: Normocephalic and atraumatic.     Nose: Nose normal.     Mouth/Throat:     Mouth: Mucous membranes are moist.  Eyes:     General: No scleral icterus.    Extraocular Movements: Extraocular movements intact.  Cardiovascular:     Rate and Rhythm: Normal rate and regular rhythm.     Pulses: Normal pulses.     Heart sounds: Normal heart sounds. No murmur heard. Pulmonary:     Breath sounds: Normal breath sounds. No wheezing or rales.  Abdominal:     Palpations: Abdomen is soft.     Tenderness: There is no abdominal tenderness.  Musculoskeletal:     Cervical back: Neck supple. No tenderness.     Right lower leg: No edema.     Left lower leg: No edema.  Skin:    General: Skin is warm.     Findings: No rash.  Neurological:     General: No focal deficit present.     Mental Status: He is alert and oriented to person, place, and time.     Sensory: No sensory deficit.     Motor: No weakness.   Psychiatric:        Mood and Affect: Mood normal.        Thought Content: Thought content normal.     BP 127/65   Pulse 71   Ht 5' 9 (1.753 m)   Wt 254 lb 12.8 oz (115.6 kg)   SpO2 96%   BMI 37.63 kg/m  Wt Readings from Last 3 Encounters:  01/30/24 254 lb  12.8 oz (115.6 kg)  07/30/23 250 lb (113.4 kg)  01/24/23 258 lb 3.2 oz (117.1 kg)    Lab Results  Component Value Date   TSH 1.470 01/24/2023   Lab Results  Component Value Date   WBC 7.0 01/24/2023   HGB 14.1 01/24/2023   HCT 42.6 01/24/2023   MCV 81 01/24/2023   PLT 363 01/24/2023   Lab Results  Component Value Date   NA 138 01/24/2023   K 4.6 01/24/2023   CO2 22 01/24/2023   GLUCOSE 98 01/24/2023   BUN 10 01/24/2023   CREATININE 1.02 01/24/2023   BILITOT 0.3 01/24/2023   ALKPHOS 76 01/24/2023   AST 20 01/24/2023   ALT 23 01/24/2023   PROT 7.6 01/24/2023   ALBUMIN 4.5 01/24/2023   CALCIUM 10.2 01/24/2023   ANIONGAP 9 12/01/2018   EGFR 105 01/24/2023   Lab Results  Component Value Date   CHOL 186 01/24/2023   Lab Results  Component Value Date   HDL 34 (L) 01/24/2023   Lab Results  Component Value Date   LDLCALC 117 (H) 01/24/2023   Lab Results  Component Value Date   TRIG 196 (H) 01/24/2023   Lab Results  Component Value Date   CHOLHDL 5.5 (H) 01/24/2023   Lab Results  Component Value Date   HGBA1C 5.9 07/30/2023      Assessment & Plan:   Problem List Items Addressed This Visit       Respiratory   Asthma   Well-controlled On Albuterol  inhaler PRN      Relevant Medications   albuterol  (VENTOLIN  HFA) 108 (90 Base) MCG/ACT inhaler     Other   MDD (major depressive disorder), recurrent, severe, with psychosis (HCC)   Uncontrolled due to noncompliance, advised to remain compliant with medications and psychiatry follow-up Follows up with Psychiatrist On Trazodone  and Atarax  with Risperidone  and Benztropin (for schizophrenia) - needs to be compliant      Relevant Orders    TSH   CMP14+EGFR   CBC with Differential/Platelet   Undifferentiated schizophrenia (HCC)   Uncontrolled Follows up with Dr Karolynn - advised to discuss with him about current symptoms On Risperidone  and Benztropine  Patient is functional in day-to-day activities, lives with his grandmother.      Relevant Orders   TSH   CMP14+EGFR   CBC with Differential/Platelet   Prediabetes   Lab Results  Component Value Date   HGBA1C 5.9 07/30/2023   Advised to follow low carb and low cholesterol diet      Relevant Orders   Hemoglobin A1c   CMP14+EGFR   Mixed hyperlipidemia   Check lipid profile Advised to follow low cholesterol diet and perform moderate exercise/walking at least 150 mins/week      Relevant Orders   Lipid panel   Obesity (BMI 30-39.9)   BMI Readings from Last 3 Encounters:  01/30/24 37.63 kg/m  07/30/23 36.92 kg/m  01/24/23 38.13 kg/m   Some of psychiatric medications might be contributing to weight gain Advised for diet modification and moderate exercise/walking      Encounter for general adult medical examination with abnormal findings - Primary   Physical exam as documented. Flu vaccine given today. Fasting blood tests today.      Chronic pain of right thumb   Due to previous hand injury causing right thumb collateral ligament rupture, s/p repair and OT Naproxen  as needed Referred to hand surgery as he has persistent thumb pain, but did not follow up  Other Visit Diagnoses       Vitamin D  deficiency       Relevant Orders   VITAMIN D  25 Hydroxy (Vit-D Deficiency, Fractures)     Encounter for immunization       Relevant Orders   Flu vaccine trivalent PF, 6mos and older(Flulaval,Afluria,Fluarix,Fluzone) (Completed)       Meds ordered this encounter  Medications   albuterol  (VENTOLIN  HFA) 108 (90 Base) MCG/ACT inhaler    Sig: Inhale 2 puffs into the lungs every 6 (six) hours as needed for wheezing or shortness of breath.    Dispense:  18 g     Refill:  5    Follow-up: Return in about 6 months (around 07/29/2024) for Prediabetes and HLD.    Suzzane MARLA Blanch, MD

## 2024-01-30 NOTE — Assessment & Plan Note (Addendum)
 Physical exam as documented. Flu vaccine given today. Fasting blood tests today.

## 2024-01-31 LAB — CMP14+EGFR
ALT: 21 [IU]/L (ref 0–44)
AST: 19 [IU]/L (ref 0–40)
Albumin: 4.3 g/dL (ref 4.3–5.2)
Alkaline Phosphatase: 87 [IU]/L (ref 44–121)
BUN/Creatinine Ratio: 6 — ABNORMAL LOW (ref 9–20)
BUN: 6 mg/dL (ref 6–20)
Bilirubin Total: 0.3 mg/dL (ref 0.0–1.2)
CO2: 22 mmol/L (ref 20–29)
Calcium: 9.9 mg/dL (ref 8.7–10.2)
Chloride: 103 mmol/L (ref 96–106)
Creatinine, Ser: 0.96 mg/dL (ref 0.76–1.27)
Globulin, Total: 3.6 g/dL (ref 1.5–4.5)
Glucose: 102 mg/dL — ABNORMAL HIGH (ref 70–99)
Potassium: 4.6 mmol/L (ref 3.5–5.2)
Sodium: 139 mmol/L (ref 134–144)
Total Protein: 7.9 g/dL (ref 6.0–8.5)
eGFR: 112 mL/min/{1.73_m2} (ref >59)

## 2024-01-31 LAB — HEMOGLOBIN A1C
Est. average glucose Bld gHb Est-mCnc: 128 mg/dL
Hgb A1c MFr Bld: 6.1 % — ABNORMAL HIGH (ref 4.8–5.6)

## 2024-01-31 LAB — LIPID PANEL
Chol/HDL Ratio: 6.3 {ratio} — ABNORMAL HIGH (ref 0.0–5.0)
Cholesterol, Total: 203 mg/dL — ABNORMAL HIGH (ref 100–199)
HDL: 32 mg/dL — ABNORMAL LOW (ref >39)
LDL Chol Calc (NIH): 127 mg/dL — ABNORMAL HIGH (ref 0–99)
Triglycerides: 246 mg/dL — ABNORMAL HIGH (ref 0–149)
VLDL Cholesterol Cal: 44 mg/dL — ABNORMAL HIGH (ref 5–40)

## 2024-01-31 LAB — CBC WITH DIFFERENTIAL/PLATELET
Basophils Absolute: 0 10*3/uL (ref 0.0–0.2)
Basos: 1 %
EOS (ABSOLUTE): 0.2 10*3/uL (ref 0.0–0.4)
Eos: 3 %
Hematocrit: 42.2 % (ref 37.5–51.0)
Hemoglobin: 13.8 g/dL (ref 13.0–17.7)
Immature Grans (Abs): 0 10*3/uL (ref 0.0–0.1)
Immature Granulocytes: 0 %
Lymphocytes Absolute: 2 10*3/uL (ref 0.7–3.1)
Lymphs: 31 %
MCH: 27.8 pg (ref 26.6–33.0)
MCHC: 32.7 g/dL (ref 31.5–35.7)
MCV: 85 fL (ref 79–97)
Monocytes Absolute: 0.6 10*3/uL (ref 0.1–0.9)
Monocytes: 9 %
Neutrophils Absolute: 3.7 10*3/uL (ref 1.4–7.0)
Neutrophils: 56 %
Platelets: 334 10*3/uL (ref 150–450)
RBC: 4.97 x10E6/uL (ref 4.14–5.80)
RDW: 14.2 % (ref 11.6–15.4)
WBC: 6.6 10*3/uL (ref 3.4–10.8)

## 2024-01-31 LAB — VITAMIN D 25 HYDROXY (VIT D DEFICIENCY, FRACTURES): Vit D, 25-Hydroxy: 30.7 ng/mL (ref 30.0–100.0)

## 2024-01-31 LAB — TSH: TSH: 1.13 u[IU]/mL (ref 0.450–4.500)

## 2024-02-07 ENCOUNTER — Encounter: Payer: Self-pay | Admitting: Internal Medicine

## 2024-02-07 NOTE — Telephone Encounter (Signed)
This encounter was created in error - please disregard. Patient called by this RN.  States he did not call and does not need an appointment and is not sick.

## 2024-03-10 ENCOUNTER — Telehealth: Payer: Self-pay

## 2024-03-10 NOTE — Telephone Encounter (Signed)
 Copied from CRM [id number]. Topic: Clinical - Prescription Issue >> Mar 10, 2024  8:27 AM Bobbye Morton wrote: Reason for CRM: Grandmother Bobby [name] in because her grandson threw away all the medication because the doctor told him he can smoke marijuana and drink alcohol(wine) . Grandmother Bobby [name] wants the doctor to call and tell him he needs to take medication because pt will not listen to her.    Lenda Kelp. [phone]  Few medication that pt threw away: benztropine (COGENTIN) 0.5 MG tablet traZODone (DESYREL) 50 MG tablet

## 2024-07-29 ENCOUNTER — Ambulatory Visit: Payer: BC Managed Care – PPO | Admitting: Internal Medicine

## 2024-08-27 ENCOUNTER — Encounter: Payer: Self-pay | Admitting: Internal Medicine

## 2024-08-27 ENCOUNTER — Ambulatory Visit (INDEPENDENT_AMBULATORY_CARE_PROVIDER_SITE_OTHER): Payer: Self-pay | Admitting: Internal Medicine

## 2024-08-27 VITALS — BP 144/82 | HR 67 | Ht 69.0 in | Wt 253.2 lb

## 2024-08-27 DIAGNOSIS — M79644 Pain in right finger(s): Secondary | ICD-10-CM

## 2024-08-27 DIAGNOSIS — F203 Undifferentiated schizophrenia: Secondary | ICD-10-CM | POA: Diagnosis not present

## 2024-08-27 DIAGNOSIS — Z23 Encounter for immunization: Secondary | ICD-10-CM

## 2024-08-27 DIAGNOSIS — J4520 Mild intermittent asthma, uncomplicated: Secondary | ICD-10-CM

## 2024-08-27 DIAGNOSIS — G8929 Other chronic pain: Secondary | ICD-10-CM

## 2024-08-27 DIAGNOSIS — R7303 Prediabetes: Secondary | ICD-10-CM

## 2024-08-27 NOTE — Progress Notes (Addendum)
 Established Patient Office Visit  Subjective:  Patient ID: Bobby [name], male    DOB: 09-27-1996  Age: 28 y.o. MRN: [id number]  CC:  Chief Complaint  Patient presents with   Medical Management of Chronic Issues    6 month f/u     HPI Bobby [name] is a 28 y.o. male with past medical history of schizophrenia and mild, intermittent asthma who presents for f/u of his chronic medical conditions.  Prediabetes: His HbA1c was better at 5.7 today.  He admits that he still takes soft drinks almost daily.  He reports that he has cut down fried food now.   Schizophrenia: Patient sees Dr Karolynn for his schizophrenia and has not been doing well with his medications, but he has poor compliance.  He has apathy, anxiety and insomnia. He is overall independent with his ADLs.  He complains of right thumb base pain since 2016, when he had injury while playing basketball.  He had right thumb radial collateral ligament repair and had OT after it.  He still complains of chronic, sharp thumb pain.  Denies any numbness or tingling of the hand.  He has not been able to see hand surgeon yet since the last visit.   Past Medical History:  Diagnosis Date   Asthma    prn inhaler   Rupture of radial collateral ligament of right thumb 11/2015    Past Surgical History:  Procedure Laterality Date   LIGAMENT REPAIR Right 12/15/2015   Procedure: RIGHT THUMB RADIAL COLLATERAL LIGAMENT REPAIR;  Surgeon: Franky Curia, MD;  Location: [facility];  Service: Orthopedics;  Laterality: Right;    History reviewed. No pertinent family history.  Social History   Socioeconomic History   Marital status: Single    Spouse name: Not on file   Number of children: Not on file   Years of education: Not on file   Highest education level: Not on file  Occupational History   Not on file  Tobacco Use   Smoking status: Never   Smokeless tobacco: Never  Vaping Use   Vaping status: Never Used  Substance and  Sexual Activity   Alcohol use: No   Drug use: Not Currently    Types: Marijuana   Sexual activity: Not on file  Other Topics Concern   Not on file  Social History Narrative   ** Merged History Encounter **       Social Drivers of Health   Financial Resource Strain: Not on file  Food Insecurity: Not on file  Transportation Needs: Not on file  Physical Activity: Not on file  Stress: Not on file  Social Connections: Not on file  Intimate Partner Violence: Not on file    Outpatient Medications Prior to Visit  Medication Sig Dispense Refill   albuterol  (VENTOLIN  HFA) 108 (90 Base) MCG/ACT inhaler Inhale 2 puffs into the lungs every 6 (six) hours as needed for wheezing or shortness of breath. 18 g 5   benztropine  (COGENTIN ) 0.5 MG tablet Take 1 tablet (0.5 mg total) by mouth 2 (two) times daily. For prevention of drug induced tremors 60 tablet 0   Cholecalciferol (VITAMIN D -3) 125 MCG (5000 UT) TABS Take 125 mcg by mouth daily.     hydrocortisone  (ANUSOL -HC) 2.5 % rectal cream Place 1 application rectally 2 (two) times daily. 30 g 0   hydrOXYzine  (ATARAX /VISTARIL ) 25 MG tablet Take 1 tablet (25 mg total) by mouth 3 (three) times daily as needed for anxiety. 60  tablet 0   risperiDONE  (RISPERDAL ) 3 MG tablet Take 3 mg by mouth 2 (two) times daily.     traZODone  (DESYREL ) 50 MG tablet Take 1 tablet (50 mg total) by mouth at bedtime as needed for sleep. 30 tablet 0   No facility-administered medications prior to visit.    No Known Allergies  ROS Review of Systems  Constitutional:  Negative for chills and fever.  HENT:  Negative for congestion, sinus pressure, sinus pain and sore throat.   Eyes:  Negative for pain and discharge.  Respiratory:  Negative for cough and shortness of breath.   Cardiovascular:  Negative for chest pain and palpitations.  Gastrointestinal:  Negative for diarrhea, nausea and vomiting.  Endocrine: Negative for polydipsia and polyuria.  Genitourinary:   Negative for dysuria and hematuria.  Musculoskeletal:  Negative for neck pain and neck stiffness.       Right thumb pain  Skin:  Negative for rash.  Neurological:  Negative for dizziness, weakness, numbness and headaches.  Psychiatric/Behavioral:  Positive for behavioral problems, dysphoric mood and sleep disturbance. Negative for agitation.       Objective:    Physical Exam Vitals reviewed.  Constitutional:      General: He is not in acute distress.    Appearance: He is obese. He is not diaphoretic.  HENT:     Head: Normocephalic and atraumatic.     Nose: Nose normal.     Mouth/Throat:     Mouth: Mucous membranes are moist.  Eyes:     General: No scleral icterus.    Extraocular Movements: Extraocular movements intact.  Cardiovascular:     Rate and Rhythm: Normal rate and regular rhythm.     Heart sounds: Normal heart sounds. No murmur heard. Pulmonary:     Breath sounds: Normal breath sounds. No wheezing or rales.  Musculoskeletal:     Cervical back: Neck supple. No tenderness.     Right lower leg: No edema.     Left lower leg: No edema.  Skin:    General: Skin is warm.     Findings: No rash.  Neurological:     General: No focal deficit present.     Mental Status: He is alert and oriented to person, place, and time.     Sensory: No sensory deficit.     Motor: No weakness.  Psychiatric:        Mood and Affect: Mood is depressed.        Behavior: Behavior is slowed.     BP (!) 144/82   Pulse 67   Ht 5' 9 (1.753 m)   Wt 253 lb 3.2 oz (114.9 kg)   SpO2 97%   BMI 37.39 kg/m  Wt Readings from Last 3 Encounters:  08/27/24 253 lb 3.2 oz (114.9 kg)  01/30/24 254 lb 12.8 oz (115.6 kg)  07/30/23 250 lb (113.4 kg)    Lab Results  Component Value Date   TSH 1.130 01/30/2024   Lab Results  Component Value Date   WBC 6.6 01/30/2024   HGB 13.8 01/30/2024   HCT 42.2 01/30/2024   MCV 85 01/30/2024   PLT 334 01/30/2024   Lab Results  Component Value Date   NA  139 01/30/2024   K 4.6 01/30/2024   CO2 22 01/30/2024   GLUCOSE 102 (H) 01/30/2024   BUN 6 01/30/2024   CREATININE 0.96 01/30/2024   BILITOT 0.3 01/30/2024   ALKPHOS 87 01/30/2024   AST 19 01/30/2024   ALT  21 01/30/2024   PROT 7.9 01/30/2024   ALBUMIN 4.3 01/30/2024   CALCIUM 9.9 01/30/2024   ANIONGAP 9 12/01/2018   EGFR 112 01/30/2024   Lab Results  Component Value Date   CHOL 203 (H) 01/30/2024   Lab Results  Component Value Date   HDL 32 (L) 01/30/2024   Lab Results  Component Value Date   LDLCALC 127 (H) 01/30/2024   Lab Results  Component Value Date   TRIG 246 (H) 01/30/2024   Lab Results  Component Value Date   CHOLHDL 6.3 (H) 01/30/2024   Lab Results  Component Value Date   HGBA1C 6.1 (H) 01/30/2024      Assessment & Plan:   Problem List Items Addressed This Visit       Respiratory   Asthma   Well-controlled On Albuterol  inhaler PRN        Other   Undifferentiated schizophrenia (HCC) - Primary   Uncontrolled due to noncompliance Follows up with Dr Karolynn, needs follow-up, contact information provided - advised to discuss with him about current symptoms On Risperidone  and Benztropine  Patient is functional in day-to-day activities, lives with his grandmother.      Prediabetes   Lab Results  Component Value Date   HGBA1C 6.1 (H) 01/30/2024   Advised to follow low carb and low cholesterol diet      Relevant Orders   Bayer DCA Hb A1c Waived   Chronic pain of right thumb   Due to previous hand injury causing right thumb collateral ligament rupture, s/p repair and OT Naproxen  as needed Referred to hand surgery as he has persistent thumb pain      Relevant Orders   Ambulatory referral to Orthopedic Surgery   Other Visit Diagnoses       Encounter for immunization       Relevant Orders   Flu vaccine trivalent PF, 6mos and older(Flulaval,Afluria,Fluarix,Fluzone) (Completed)        No orders of the defined types were placed in this  encounter.   Follow-up: Return in about 6 months (around 02/24/2025) for Annual physical.    Suzzane MARLA Blanch, MD

## 2024-08-27 NOTE — Assessment & Plan Note (Addendum)
Due to previous hand injury causing right thumb collateral ligament rupture, s/p repair and OT Naproxen as needed Referred to hand surgery as he has persistent thumb pain

## 2024-08-27 NOTE — Assessment & Plan Note (Addendum)
 Uncontrolled due to noncompliance Follows up with Dr Karolynn, needs follow-up, contact information provided - advised to discuss with him about current symptoms On Risperidone  and Benztropine  Patient is functional in day-to-day activities, lives with his grandmother.

## 2024-08-27 NOTE — Assessment & Plan Note (Signed)
 Lab Results  Component Value Date   HGBA1C 6.1 (H) 01/30/2024   Advised to follow low carb and low cholesterol diet

## 2024-08-27 NOTE — Assessment & Plan Note (Signed)
Well-controlled On Albuterol inhaler PRN 

## 2024-08-27 NOTE — Patient Instructions (Addendum)
 Schedule your Medicare Annual Wellness Visit at checkout.  Please contact [facility] at [phone] for follow-up.  Please continue to take medications as prescribed.  Please continue to follow low carb diet and perform moderate exercise/walking at least 150 mins/week.

## 2024-08-29 LAB — BAYER DCA HB A1C WAIVED: HB A1C (BAYER DCA - WAIVED): 5.6 % (ref 4.8–5.6)

## 2024-09-01 ENCOUNTER — Telehealth: Payer: Self-pay | Admitting: Orthopedic Surgery

## 2024-09-01 NOTE — Telephone Encounter (Signed)
 Called pt 1X and left vm for pt to have appt  with Dr Erwin for chronic right thumb pains. Please attach referral

## 2024-09-08 ENCOUNTER — Telehealth: Payer: Self-pay | Admitting: Orthopedic Surgery

## 2024-09-08 NOTE — Telephone Encounter (Signed)
 Called pt and pt declined to be seen for chronic pains in right thumb. Please closed referral.

## 2024-09-26 ENCOUNTER — Other Ambulatory Visit: Payer: Self-pay

## 2024-09-26 ENCOUNTER — Emergency Department (HOSPITAL_COMMUNITY)
Admission: EM | Admit: 2024-09-26 | Discharge: 2024-09-27 | Disposition: A | Discharge status: Other Acute Inpatient Hospital | Attending: Emergency Medicine | Admitting: Emergency Medicine

## 2024-09-26 ENCOUNTER — Encounter (HOSPITAL_COMMUNITY): Payer: Self-pay

## 2024-09-26 DIAGNOSIS — F209 Schizophrenia, unspecified: Secondary | ICD-10-CM | POA: Diagnosis not present

## 2024-09-26 DIAGNOSIS — F22 Delusional disorders: Secondary | ICD-10-CM | POA: Insufficient documentation

## 2024-09-26 DIAGNOSIS — F259 Schizoaffective disorder, unspecified: Secondary | ICD-10-CM | POA: Diagnosis not present

## 2024-09-26 DIAGNOSIS — J45909 Unspecified asthma, uncomplicated: Secondary | ICD-10-CM | POA: Insufficient documentation

## 2024-09-26 DIAGNOSIS — R441 Visual hallucinations: Secondary | ICD-10-CM | POA: Insufficient documentation

## 2024-09-26 DIAGNOSIS — R443 Hallucinations, unspecified: Secondary | ICD-10-CM

## 2024-09-26 DIAGNOSIS — Z91148 Patient's other noncompliance with medication regimen for other reason: Secondary | ICD-10-CM | POA: Diagnosis not present

## 2024-09-26 LAB — URINE DRUG SCREEN
Amphetamines: NEGATIVE
Barbiturates: NEGATIVE
Benzodiazepines: NEGATIVE
Cocaine: NEGATIVE
Fentanyl: NEGATIVE
Methadone Scn, Ur: NEGATIVE
Opiates: NEGATIVE
Tetrahydrocannabinol: POSITIVE — AB

## 2024-09-26 LAB — CBC WITH DIFFERENTIAL/PLATELET
Abs Immature Granulocytes: 0.01 K/uL (ref 0.00–0.07)
Basophils Absolute: 0.1 K/uL (ref 0.0–0.1)
Basophils Relative: 1 %
Eosinophils Absolute: 0.2 K/uL (ref 0.0–0.5)
Eosinophils Relative: 3 %
HCT: 40 % (ref 39.0–52.0)
Hemoglobin: 13 g/dL (ref 13.0–17.0)
Immature Granulocytes: 0 %
Lymphocytes Relative: 30 %
Lymphs Abs: 1.9 K/uL (ref 0.7–4.0)
MCH: 27.6 pg (ref 26.0–34.0)
MCHC: 32.5 g/dL (ref 30.0–36.0)
MCV: 84.9 fL (ref 80.0–100.0)
Monocytes Absolute: 0.6 K/uL (ref 0.1–1.0)
Monocytes Relative: 9 %
Neutro Abs: 3.6 K/uL (ref 1.7–7.7)
Neutrophils Relative %: 57 %
Platelets: 322 K/uL (ref 150–400)
RBC: 4.71 MIL/uL (ref 4.22–5.81)
RDW: 13.6 % (ref 11.5–15.5)
WBC: 6.3 K/uL (ref 4.0–10.5)
nRBC: 0 % (ref 0.0–0.2)

## 2024-09-26 LAB — COMPREHENSIVE METABOLIC PANEL WITH GFR
ALT: 25 U/L (ref 0–44)
AST: 21 U/L (ref 15–41)
Albumin: 4.1 g/dL (ref 3.5–5.0)
Alkaline Phosphatase: 76 U/L (ref 38–126)
Anion gap: 13 (ref 5–15)
BUN: 5 mg/dL — ABNORMAL LOW (ref 6–20)
CO2: 22 mmol/L (ref 22–32)
Calcium: 9.4 mg/dL (ref 8.9–10.3)
Chloride: 105 mmol/L (ref 98–111)
Creatinine, Ser: 1.12 mg/dL (ref 0.61–1.24)
GFR, Estimated: >60 mL/min (ref >60)
Glucose, Bld: 85 mg/dL (ref 70–99)
Potassium: 3.9 mmol/L (ref 3.5–5.1)
Sodium: 140 mmol/L (ref 135–145)
Total Bilirubin: 0.3 mg/dL (ref 0.0–1.2)
Total Protein: 7.2 g/dL (ref 6.5–8.1)

## 2024-09-26 LAB — ACETAMINOPHEN LEVEL: Acetaminophen (Tylenol), Serum: <10 ug/mL — ABNORMAL LOW (ref 10–30)

## 2024-09-26 LAB — SALICYLATE LEVEL: Salicylate Lvl: <7 mg/dL — ABNORMAL LOW (ref 7.0–30.0)

## 2024-09-26 LAB — ETHANOL: Alcohol, Ethyl (B): <15 mg/dL (ref <15)

## 2024-09-26 MED ORDER — ZOLPIDEM TARTRATE 5 MG PO TABS: 5.0000 mg | ORAL_TABLET | Freq: Every evening | ORAL | Status: DC | PRN

## 2024-09-26 MED ORDER — ONDANSETRON HCL 4 MG PO TABS: 4.0000 mg | ORAL_TABLET | Freq: Three times a day (TID) | ORAL | Status: DC | PRN

## 2024-09-26 MED ORDER — ACETAMINOPHEN 325 MG PO TABS: 650.0000 mg | ORAL_TABLET | ORAL | Status: DC | PRN

## 2024-09-26 NOTE — BH Assessment (Signed)
 Clinician spoke to [place] with [facility] to complete pt's TTS assessment. Clinician provided pt's name, MRN, location, age, room number and provider's name. Secure message completed.    [facility] coordinator to update secure chat when assessment time and provider are assigned.  Jackson JONETTA Broach, MS, [facility], [facility] Triage Specialist [phone]
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
## 2024-09-26 NOTE — ED Notes (Signed)
 grandmother updated as to patient's status.

## 2024-09-26 NOTE — ED Notes (Signed)
Pt ambulated to the bathroom and back to room

## 2024-09-26 NOTE — ED Notes (Signed)
 Patients belongings are in locker #1. Bag of clothing and pair of shoes.

## 2024-09-26 NOTE — ED Provider Notes (Signed)
 [place] EMERGENCY DEPARTMENT AT [facility] Provider Note   CSN: [id number] Arrival date & time: 09/26/24  1650     Patient presents with: IVC   Bobby [name] is a 28 y.o. male.   Patient is a 28 year old male with a past medical history of schizophrenia who presents to the emergency department under an IVC which was taken out by his grandmother.  Is noted that the patient has had been having increased hallucinations and delusions.  He has been wandering around in his house with no close on as well as seeing other individuals are not present in the room.  He has been making comments about wanting to kill people who are not there.  On evaluation the patient does state that he feels as though someone is out to get him.  Grandmother notes that he has been noncompliant with his medications.  He denies any illicit substance abuse other than marijuana which he used with his sister.  He denies any alcohol use.  He denies any suicidal ideation.  He has no physical complaints at this time to include fever, chills, chest pain, shortness of breath, abdominal pain, nausea, vomiting, diarrhea.        Prior to Admission medications   Medication Sig Start Date End Date Taking? Authorizing Provider  albuterol  (VENTOLIN  HFA) 108 (90 Base) MCG/ACT inhaler Inhale 2 puffs into the lungs every 6 (six) hours as needed for wheezing or shortness of breath. 01/30/24   Tobie Suzzane POUR, MD  benztropine  (COGENTIN ) 0.5 MG tablet Take 1 tablet (0.5 mg total) by mouth 2 (two) times daily. For prevention of drug induced tremors 12/05/18   Collene Gouge I, NP  Cholecalciferol (VITAMIN D -3) 125 MCG (5000 UT) TABS Take 125 mcg by mouth daily.    [provider]  hydrocortisone  (ANUSOL -HC) 2.5 % rectal cream Place 1 application rectally 2 (two) times daily. 01/23/22   Tobie Suzzane POUR, MD  hydrOXYzine  (ATARAX /VISTARIL ) 25 MG tablet Take 1 tablet (25 mg total) by mouth 3 (three) times daily as needed for  anxiety. 12/05/18   Collene Gouge I, NP  risperiDONE  (RISPERDAL ) 3 MG tablet Take 3 mg by mouth 2 (two) times daily. 12/23/23   [provider]  traZODone  (DESYREL ) 50 MG tablet Take 1 tablet (50 mg total) by mouth at bedtime as needed for sleep. 12/05/18   Collene Gouge FERNS, NP    Allergies: Patient has no known allergies.    Review of Systems  Psychiatric/Behavioral:  Positive for hallucinations.   All other systems reviewed and are negative.   Updated Vital Signs BP 121/73   Pulse 88   Temp 99.9 F (37.7 C) (Oral)   Resp 16   Ht 5' 9 (1.753 m)   Wt 116.1 kg   SpO2 97%   BMI 37.80 kg/m   Physical Exam Vitals and nursing note reviewed.  Constitutional:      General: He is not in acute distress.    Appearance: Normal appearance. He is not ill-appearing.  HENT:     Head: Normocephalic and atraumatic.     Nose: Nose normal.     Mouth/Throat:     Mouth: Mucous membranes are moist.  Eyes:     Extraocular Movements: Extraocular movements intact.     Conjunctiva/sclera: Conjunctivae normal.     Pupils: Pupils are equal, round, and reactive to light.  Cardiovascular:     Rate and Rhythm: Normal rate and regular rhythm.     Pulses: Normal  pulses.     Heart sounds: Normal heart sounds. No murmur heard.    No gallop.  Pulmonary:     Effort: Pulmonary effort is normal. No respiratory distress.     Breath sounds: Normal breath sounds. No stridor. No wheezing, rhonchi or rales.  Abdominal:     General: Abdomen is flat. Bowel sounds are normal. There is no distension.     Palpations: Abdomen is soft.     Tenderness: There is no abdominal tenderness. There is no guarding.  Musculoskeletal:        General: Normal range of motion.     Cervical back: Normal range of motion and neck supple. No rigidity or tenderness.  Skin:    General: Skin is warm and dry.  Neurological:     General: No focal deficit present.     Mental Status: He is alert and oriented to person, place,  and time. Mental status is at baseline.     Cranial Nerves: No cranial nerve deficit.     Sensory: No sensory deficit.     Motor: No weakness.     Coordination: Coordination normal.     Gait: Gait normal.  Psychiatric:        Attention and Perception: He is attentive. He perceives visual hallucinations. He does not perceive auditory hallucinations.        Mood and Affect: Mood is not anxious, depressed or elated. Affect is blunt and flat. Affect is not labile, angry, tearful or inappropriate.        Speech: He is communicative. Speech is delayed. Speech is not rapid and pressured, slurred or tangential.        Behavior: Behavior is slowed and actively hallucinating. Behavior is not agitated, aggressive, withdrawn, hyperactive or combative.        Thought Content: Thought content is paranoid and delusional. Thought content does not include homicidal or suicidal ideation. Thought content does not include homicidal or suicidal plan.        Judgment: Judgment is not impulsive or inappropriate.     (all labs ordered are listed, but only abnormal results are displayed) Labs Reviewed  COMPREHENSIVE METABOLIC PANEL WITH GFR  ETHANOL  URINE DRUG SCREEN  CBC WITH DIFFERENTIAL/PLATELET  SALICYLATE LEVEL  ACETAMINOPHEN  LEVEL    EKG: None  Radiology: No results found.   Procedures   Medications Ordered in the ED - No data to display                                  Medical Decision Making Patient does remain stable at this time.  Blood work has been unremarkable and he is otherwise medically cleared for psychiatric dispo at this point.  Amount and/or Complexity of Data Reviewed Labs: ordered.  Risk OTC drugs. Prescription drug management.        Final diagnoses:  None    ED Discharge Orders     None          Daralene Lonni JONETTA DEVONNA 09/26/24 1854    Dean Clarity, MD 09/26/24 2142

## 2024-09-27 ENCOUNTER — Encounter (HOSPITAL_COMMUNITY): Payer: Self-pay | Admitting: Psychiatry

## 2024-09-27 ENCOUNTER — Inpatient Hospital Stay (HOSPITAL_COMMUNITY)
Admission: AD | Admit: 2024-09-27 | Discharge: 2024-10-02 | DRG: 885 | Disposition: A | Discharge status: Home / Self Care | Attending: Student in an Organized Health Care Education/Training Program | Admitting: Student in an Organized Health Care Education/Training Program

## 2024-09-27 DIAGNOSIS — Z9152 Personal history of nonsuicidal self-harm: Secondary | ICD-10-CM

## 2024-09-27 DIAGNOSIS — M79644 Pain in right finger(s): Secondary | ICD-10-CM | POA: Diagnosis present

## 2024-09-27 DIAGNOSIS — F259 Schizoaffective disorder, unspecified: Principal | ICD-10-CM | POA: Diagnosis present

## 2024-09-27 DIAGNOSIS — F209 Schizophrenia, unspecified: Principal | ICD-10-CM | POA: Diagnosis present

## 2024-09-27 DIAGNOSIS — Z91148 Patient's other noncompliance with medication regimen for other reason: Secondary | ICD-10-CM | POA: Diagnosis not present

## 2024-09-27 DIAGNOSIS — F419 Anxiety disorder, unspecified: Secondary | ICD-10-CM | POA: Diagnosis present

## 2024-09-27 DIAGNOSIS — E785 Hyperlipidemia, unspecified: Secondary | ICD-10-CM | POA: Diagnosis present

## 2024-09-27 DIAGNOSIS — E663 Overweight: Secondary | ICD-10-CM | POA: Diagnosis present

## 2024-09-27 DIAGNOSIS — Z6835 Body mass index (BMI) 35.0-35.9, adult: Secondary | ICD-10-CM

## 2024-09-27 DIAGNOSIS — J45909 Unspecified asthma, uncomplicated: Secondary | ICD-10-CM | POA: Diagnosis present

## 2024-09-27 DIAGNOSIS — Z9151 Personal history of suicidal behavior: Secondary | ICD-10-CM

## 2024-09-27 DIAGNOSIS — Z79899 Other long term (current) drug therapy: Secondary | ICD-10-CM | POA: Diagnosis not present

## 2024-09-27 MED ORDER — BENZTROPINE MESYLATE 0.5 MG PO TABS
0.5000 mg | ORAL_TABLET | Freq: Two times a day (BID) | ORAL | Status: DC
  Administered 2024-09-27 – 2024-10-01 (×7): 0.5 mg via ORAL
  Filled 2024-09-27 (×9): qty 1

## 2024-09-27 MED ORDER — HALOPERIDOL LACTATE 5 MG/ML IJ SOLN: 10.0000 mg | Freq: Three times a day (TID) | INTRAMUSCULAR | Status: DC | PRN

## 2024-09-27 MED ORDER — RISPERIDONE 3 MG PO TABS
3.0000 mg | ORAL_TABLET | Freq: Two times a day (BID) | ORAL | Status: DC
  Administered 2024-09-27 – 2024-09-29 (×5): 3 mg via ORAL
  Filled 2024-09-27 (×6): qty 1

## 2024-09-27 MED ORDER — ACETAMINOPHEN 325 MG PO TABS
650.0000 mg | ORAL_TABLET | Freq: Four times a day (QID) | ORAL | Status: DC | PRN
  Administered 2024-09-28 – 2024-09-29 (×2): 650 mg via ORAL
  Filled 2024-09-27 (×2): qty 2

## 2024-09-27 MED ORDER — TRAZODONE HCL 50 MG PO TABS
50.0000 mg | ORAL_TABLET | Freq: Every evening | ORAL | Status: DC | PRN
  Administered 2024-09-29: 50 mg via ORAL
  Filled 2024-09-27 (×2): qty 1

## 2024-09-27 MED ORDER — HYDROXYZINE HCL 25 MG PO TABS
25.0000 mg | ORAL_TABLET | Freq: Three times a day (TID) | ORAL | Status: DC | PRN
  Administered 2024-09-29: 25 mg via ORAL
  Filled 2024-09-27 (×2): qty 1

## 2024-09-27 MED ORDER — HALOPERIDOL 5 MG PO TABS: 5.0000 mg | ORAL_TABLET | Freq: Three times a day (TID) | ORAL | Status: DC | PRN

## 2024-09-27 MED ORDER — DIPHENHYDRAMINE HCL 25 MG PO CAPS: 50.0000 mg | ORAL_CAPSULE | Freq: Three times a day (TID) | ORAL | Status: DC | PRN

## 2024-09-27 MED ORDER — LORAZEPAM 2 MG/ML IJ SOLN: 2.0000 mg | Freq: Three times a day (TID) | INTRAMUSCULAR | Status: DC | PRN

## 2024-09-27 MED ORDER — TRAZODONE HCL 50 MG PO TABS: 50.0000 mg | ORAL_TABLET | Freq: Every evening | ORAL | Status: DC | PRN

## 2024-09-27 MED ORDER — ONDANSETRON HCL 4 MG PO TABS: 4.0000 mg | ORAL_TABLET | Freq: Three times a day (TID) | ORAL | Status: DC | PRN

## 2024-09-27 MED ORDER — MAGNESIUM HYDROXIDE 400 MG/5ML PO SUSP: 30.0000 mL | Freq: Every day | ORAL | Status: DC | PRN

## 2024-09-27 MED ORDER — ALBUTEROL SULFATE (2.5 MG/3ML) 0.083% IN NEBU: 3.0000 mL | INHALATION_SOLUTION | Freq: Four times a day (QID) | RESPIRATORY_TRACT | Status: DC | PRN

## 2024-09-27 MED ORDER — DIPHENHYDRAMINE HCL 50 MG/ML IJ SOLN: 50.0000 mg | Freq: Three times a day (TID) | INTRAMUSCULAR | Status: DC | PRN

## 2024-09-27 MED ORDER — VITAMIN D3 25 MCG (1000 UNIT) PO TABS
125.0000 ug | ORAL_TABLET | Freq: Every day | ORAL | Status: DC
  Filled 2024-09-27 (×2): qty 5

## 2024-09-27 MED ORDER — RISPERIDONE 1 MG PO TABS
3.0000 mg | ORAL_TABLET | Freq: Two times a day (BID) | ORAL | Status: DC
  Administered 2024-09-27: 3 mg via ORAL
  Filled 2024-09-27: qty 3

## 2024-09-27 MED ORDER — ALUM & MAG HYDROXIDE-SIMETH 200-200-20 MG/5ML PO SUSP: 30.0000 mL | ORAL | Status: DC | PRN

## 2024-09-27 MED ORDER — HALOPERIDOL LACTATE 5 MG/ML IJ SOLN: 5.0000 mg | Freq: Three times a day (TID) | INTRAMUSCULAR | Status: DC | PRN

## 2024-09-27 NOTE — ED Notes (Signed)
 Grandmother updated concerning recommended in patient treatment as well as visiting hours her in the ED.

## 2024-09-27 NOTE — ED Notes (Signed)
 Pt ambulated to the restroom.

## 2024-09-27 NOTE — ED Notes (Signed)
 Sitter asked patient if he wanted the remote to watch something on tv, something to drink or if he was hungry, patient declined and said he didn't want anything. Patient is currently sitting on the side of the bed. Patient allowed this sitter to get his vitals.

## 2024-09-27 NOTE — ED Notes (Signed)
 Grandmother is visiting the patient.

## 2024-09-27 NOTE — Progress Notes (Signed)
   09/27/24 2050  Psych Admission Type (Psych Patients Only)  Admission Status Involuntary  Psychosocial Assessment  Patient Complaints None  Eye Contact Fair  Facial Expression Flat  Affect Flat  Speech Logical/coherent;Slow  Interaction Guarded  Motor Activity Slow  Appearance/Hygiene In scrubs  Behavior Characteristics Unwilling to participate  Mood Pleasant  Thought Process  Coherency WDL  Content WDL  Delusions None reported or observed  Perception WDL  Hallucination None reported or observed  Judgment Poor  Confusion None  Danger to Self  Current suicidal ideation? Denies

## 2024-09-27 NOTE — ED Provider Notes (Signed)
 Bobby [name] has been boarding in our Emergency Room pending psychiatry evaluation, recommendations and final disposition. On review of her labs and vital signs below, I would consider them cleared for psychiatric disposition. They have evaluated them and recommend inpatient. Meds ordered .   Vitals:   09/26/24 1709 09/26/24 1712 09/26/24 1940  BP:  121/73   Pulse:  88   Resp:  16   Temp:  99.9 F (37.7 C)   TempSrc:  Oral   SpO2:  97% 97%  Weight: 116.1 kg    Height: 5' 9 (1.753 m)       Labs Reviewed  COMPREHENSIVE METABOLIC PANEL WITH GFR - Abnormal; Notable for the following components:      Result Value   BUN 5 (*)    All other components within normal limits  URINE DRUG SCREEN - Abnormal; Notable for the following components:   Tetrahydrocannabinol POSITIVE (*)    All other components within normal limits  SALICYLATE LEVEL - Abnormal; Notable for the following components:   Salicylate Lvl <7.0 (*)    All other components within normal limits  ACETAMINOPHEN  LEVEL - Abnormal; Notable for the following components:   Acetaminophen  (Tylenol ), Serum <10 (*)    All other components within normal limits  ETHANOL  CBC WITH DIFFERENTIAL/PLATELET       Lorette Mayo, MD 09/27/24 [phone]

## 2024-09-27 NOTE — Consult Note (Signed)
 [facility] Consult Note  Patient Name: Bobby [name] MRN: [id number] DOB: Jul 14, 1996 DATE OF Consult: 09/27/2024  PRIMARY PSYCHIATRIC DIAGNOSES  1.  Schizophrenia  2.  Medication noncompliance  RECOMMENDATIONS  Inpatient psychiatric admission for stabilization, medication management, and monitoring.  Medication recommendations:  Risperdal  3mg  BID for psychosis. Please stop all antipsychotic and QTc prolonging medications if patient's QTc is greater than 500 ms. Trazodone  50mg  PO at bedtime PRN for sleep.  Non-Medication/therapeutic recommendations:  Maintain safety precautions.  Refer to outpatient psychiatric provider for medication management and therapy upon discharge from inpatient psych admission.   Communication: Treatment team members (and family members if applicable) who were involved in treatment/care discussions and planning, and with whom we spoke or engaged with via secure text/chat, include the following: patient's treatment team.   Thank you for involving us  in the care of this patient. If you have any additional questions or concerns, please call [phone] and ask for me or the provider on-call.  TELEPSYCHIATRY ATTESTATION & CONSENT  As the provider for this telehealth consult, I attest that I verified the patient's identity using two separate identifiers, introduced myself to the patient, provided my credentials, disclosed my location, and performed this encounter via a HIPAA-compliant, real-time, face-to-face, two-way, interactive audio and video platform and with the full consent and agreement of the patient (or guardian as applicable.)  Patient physical location: [facility]. Telehealth provider physical location: home office in state of [place].  Video start time: 0115 (Central Time) Video end time: 0131 (Central Time)  IDENTIFYING DATA  Bobby [name] is a 28 y.o. year-old male for whom a psychiatric consultation has been ordered by the primary  provider. The patient was identified using two separate identifiers.  CHIEF COMPLAINT/REASON FOR CONSULT  - I don't know. - Pt. was brought to the hospital by police after his grandmother initiated an IVC due to psychotic behavior, including hallucinations, delusions, and threats toward people not present.  HISTORY OF PRESENT ILLNESS (HPI)  A 28 year old man was brought to the hospital by police after his grandmother initiated an involuntary commitment due to concerns about psychotic behavior.  Per ER note,  pt was diagnosed schizophrenia at age 43 after taking a drug. 2 weeks ago sister took him and they smoke marijuana together and sense pt has been acting unusual. Per petitioner pt was caught on camera waling around 4 hours outside naked. Grandmother states pt does not know who she is per [employer] pt did not know who he was going to kill him. Grandmother states pt just stares at her and worried about her well-being and pt's.   During interview, patient was noted to be a poor historian, frequently responding to questions with brief or uncertain answers. He was unable to articulate the reason for his hospitalization or the events leading up to it. Chart review revealed a history of schizophrenia, prior inpatient admission in 2018 and 2019 for psychosis and erratic behavior, and outpatient psychiatric care at [facility]. Patient also has a history of medication noncompliance.  Recent symptoms included auditory hallucinations, with the patient acknowledging hearing voices that threatened harm. He expressed a belief that people were out to get him, though he could not specify who. There was no endorsement of visual hallucinations during the interview. The patient described a vague sense of guilt, stating he felt as though he had stolen something, but was unable to provide details. He admitted to nonadherence with prescribed psychiatric medications, citing discomfort with their effects but unable to  elaborate  further. He was unaware of the specific medications he had been prescribed, though chart review listed Abilify  Maintena, Cogentin , hydroxyzine , risperidone , trazodone , tegretol  and Seroquel  among his treatments.  Sleep disturbance and poor appetite were reported, with the patient stating he had not been sleeping or eating well. He denied current suicidal and homicidal ideation but acknowledged a history of self-harm, describing a past episode of cutting, though he could not recall specifics. There was no current employment, and he resides with his grandmother and cousin, with his grandmother serving as his primary caregiver. Occasional cannabis use was admitted, but not described as habitual. No current depressive symptoms were endorsed during the interview.  PAST PSYCHIATRIC HISTORY  - History of schizophrenia diagnosed previously; past suicide attempt by cutting, though details and timing are unclear. - Prior inpatient admission in 2018 and 2019 for psychosis and erratic behavior, with noted medication noncompliance; outpatient services at [facility] documented. - Previously prescribed Abilify  Maintena, Cogentin , hydroxyzine , risperidone , trazodone , tegretol  and Seroquel ; details regarding dose, duration, response, and side effects not specified. Otherwise as per HPI above.  PAST MEDICAL HISTORY  Past Medical History:  Diagnosis Date   Asthma    prn inhaler   Rupture of radial collateral ligament of right thumb 11/2015     HOME MEDICATIONS  Facility Ordered Medications  Medication   acetaminophen  (TYLENOL ) tablet 650 mg   zolpidem (AMBIEN) tablet 5 mg   ondansetron  (ZOFRAN ) tablet 4 mg   PTA Medications  Medication Sig   benztropine  (COGENTIN ) 0.5 MG tablet Take 1 tablet (0.5 mg total) by mouth 2 (two) times daily. For prevention of drug induced tremors   hydrOXYzine  (ATARAX /VISTARIL ) 25 MG tablet Take 1 tablet (25 mg total) by mouth 3 (three) times daily as needed for anxiety.   traZODone   (DESYREL ) 50 MG tablet Take 1 tablet (50 mg total) by mouth at bedtime as needed for sleep.   Cholecalciferol (VITAMIN D -3) 125 MCG (5000 UT) TABS Take 125 mcg by mouth daily.   hydrocortisone  (ANUSOL -HC) 2.5 % rectal cream Place 1 application rectally 2 (two) times daily.   risperiDONE  (RISPERDAL ) 3 MG tablet Take 3 mg by mouth 2 (two) times daily.   albuterol  (VENTOLIN  HFA) 108 (90 Base) MCG/ACT inhaler Inhale 2 puffs into the lungs every 6 (six) hours as needed for wheezing or shortness of breath.     ALLERGIES  No Known Allergies  SOCIAL & SUBSTANCE USE HISTORY  Social History   Socioeconomic History   Marital status: Single    Spouse name: Not on file   Number of children: Not on file   Years of education: Not on file   Highest education level: Not on file  Occupational History   Not on file  Tobacco Use   Smoking status: Never   Smokeless tobacco: Never  Vaping Use   Vaping status: Never Used  Substance and Sexual Activity   Alcohol use: No   Drug use: Yes    Types: Marijuana   Sexual activity: Not Currently  Other Topics Concern   Not on file  Social History Narrative   ** Merged History Encounter **       Social Drivers of Health   Financial Resource Strain: Not on file  Food Insecurity: Not on file  Transportation Needs: Not on file  Physical Activity: Not on file  Stress: Not on file  Social Connections: Not on file   Social History   Tobacco Use  Smoking Status Never  Smokeless Tobacco Never  Social History   Substance and Sexual Activity  Alcohol Use No   Social History   Substance and Sexual Activity  Drug Use Yes   Types: Marijuana    Additional pertinent information .  FAMILY HISTORY  History reviewed. No pertinent family history. Family Psychiatric History (if known):  unknown  MENTAL STATUS EXAM (MSE)  Mental Status Exam: General Appearance: wearing hospital scrubs  Orientation:  Alert and oriented to person and place  Memory:   poor  Concentration:  fair  Recall:  Fair  Attention  Fair  Eye Contact:  Fair  Speech:  Slow  Language:  Good  Volume:  Normal  Mood:  No sadness or depression endorsed; mood appears neutral.  Affect:  appears subdued and congruent with the content discussed.  Thought Process:  is not fully linear; responses are brief and sometimes tangential and blocking.  Thought Content:   Patient endorsed feeling that people are out to get him and hearing voices telling him they are going to harm him. Expressed uncertainty about duration. Stated I feel like I stole something and I feel like I cut myself before. There is mention of paranoia and auditory hallucinations.   Suicidal Thoughts:  No  Homicidal Thoughts:  No  Judgement:  Impaired  Insight:  is limited; patient is not aware of reason for hospitalization and is noncompliant with medication due to not liking how it makes him feel.   Psychomotor Activity:  Normal  Akathisia:  Negative  Fund of Knowledge:  poor    Assets:  Housing Social Support  Cognition:  limited and patient is a poor historian.  ADL's:  Intact  AIMS (if indicated):       VITALS  Blood pressure 121/73, pulse 88, temperature 99.9 F (37.7 C), temperature source Oral, resp. rate 16, height 5' 9 (1.753 m), weight 116.1 kg, SpO2 97%.  LABS  Admission on 09/26/2024  Component Date Value Ref Range Status   Sodium 09/26/2024 140  135 - 145 mmol/L Final   Potassium 09/26/2024 3.9  3.5 - 5.1 mmol/L Final   Chloride 09/26/2024 105  98 - 111 mmol/L Final   CO2 09/26/2024 22  22 - 32 mmol/L Final   Glucose, Bld 09/26/2024 85  70 - 99 mg/dL Final   Glucose reference range applies only to samples taken after fasting for at least 8 hours.   BUN 09/26/2024 5 (L)  6 - 20 mg/dL Final   Creatinine, Ser 09/26/2024 1.12  0.61 - 1.24 mg/dL Final   Calcium 89/95/7974 9.4  8.9 - 10.3 mg/dL Final   Total Protein 89/95/7974 7.2  6.5 - 8.1 g/dL Final   Albumin 89/95/7974 4.1  3.5  - 5.0 g/dL Final   AST 89/95/7974 21  15 - 41 U/L Final   ALT 09/26/2024 25  0 - 44 U/L Final   Alkaline Phosphatase 09/26/2024 76  38 - 126 U/L Final   Total Bilirubin 09/26/2024 0.3  0.0 - 1.2 mg/dL Final   GFR, Estimated 09/26/2024 >60  >60 mL/min Final   Comment: (NOTE) Calculated using the CKD-EPI Creatinine Equation (2021)    Anion gap 09/26/2024 13  5 - 15 Final   Performed at [facility], [address] [place], [place] [address]   Alcohol, Ethyl (B) 09/26/2024 <15  <15 mg/dL Final   Comment: (NOTE) For medical purposes only. Performed at [facility], [address] [place], [place] [address]    Opiates 09/26/2024 NEGATIVE  NEGATIVE Final   Cocaine  09/26/2024 NEGATIVE  NEGATIVE Final   Benzodiazepines 09/26/2024 NEGATIVE  NEGATIVE Final   Amphetamines 09/26/2024 NEGATIVE  NEGATIVE Final   Tetrahydrocannabinol 09/26/2024 POSITIVE (A)  NEGATIVE Final   Barbiturates 09/26/2024 NEGATIVE  NEGATIVE Final   Methadone Scn, Ur 09/26/2024 NEGATIVE  NEGATIVE Final   Fentanyl  09/26/2024 NEGATIVE  NEGATIVE Final   Comment: (NOTE) Drug screen is for Medical Purposes only. Positive results are preliminary only. If confirmation is needed, notify lab within 5 days.  Drug Class                 Cutoff (ng/mL) Amphetamine and metabolites 1000 Barbiturate and metabolites 200 Benzodiazepine              200 Opiates and metabolites     300 Cocaine and metabolites     300 THC                         50 Fentanyl                     5 Methadone                   300  Trazodone  is metabolized in vivo to several metabolites,  including pharmacologically active m-CPP, which is excreted in the  urine.  Immunoassay screens for amphetamines and MDMA have potential  cross-reactivity with these compounds and may provide false positive  result.  Performed at [facility], [address], [place], [place] [address]    WBC 09/26/2024 6.3  4.0 - 10.5 K/uL Final   RBC 09/26/2024 4.71  4.22 - 5.81  MIL/uL Final   Hemoglobin 09/26/2024 13.0  13.0 - 17.0 g/dL Final   HCT 89/95/7974 40.0  39.0 - 52.0 % Final   MCV 09/26/2024 84.9  80.0 - 100.0 fL Final   MCH 09/26/2024 27.6  26.0 - 34.0 pg Final   MCHC 09/26/2024 32.5  30.0 - 36.0 g/dL Final   RDW 89/95/7974 13.6  11.5 - 15.5 % Final   Platelets 09/26/2024 322  150 - 400 K/uL Final   nRBC 09/26/2024 0.0  0.0 - 0.2 % Final   Neutrophils Relative % 09/26/2024 57  % Final   Neutro Abs 09/26/2024 3.6  1.7 - 7.7 K/uL Final   Lymphocytes Relative 09/26/2024 30  % Final   Lymphs Abs 09/26/2024 1.9  0.7 - 4.0 K/uL Final   Monocytes Relative 09/26/2024 9  % Final   Monocytes Absolute 09/26/2024 0.6  0.1 - 1.0 K/uL Final   Eosinophils Relative 09/26/2024 3  % Final   Eosinophils Absolute 09/26/2024 0.2  0.0 - 0.5 K/uL Final   Basophils Relative 09/26/2024 1  % Final   Basophils Absolute 09/26/2024 0.1  0.0 - 0.1 K/uL Final   Immature Granulocytes 09/26/2024 0  % Final   Abs Immature Granulocytes 09/26/2024 0.01  0.00 - 0.07 K/uL Final   Performed at [facility], [address] [place], [place] [address]   Salicylate Lvl 09/26/2024 <7.0 (L)  7.0 - 30.0 mg/dL Final   Performed at [facility], [address], [place], [place] [address]   Acetaminophen  (Tylenol ), Serum 09/26/2024 <10 (L)  10 - 30 ug/mL Final   Comment: (NOTE) Toxic concentrations can be more effectively related to post dose interval; > 200, > 100, and > 50 ug/mL serum concentrations correspond to toxic concentrations at 4, 8, and 12 hours post dose, respectively.  Performed at [facility], [id number]  [address], [place], [place] [address]     PSYCHIATRIC REVIEW OF SYSTEMS (ROS)  - Auditory hallucinations present, with voices threatening harm. - Paranoid ideation, feeling that people are out to get him. - No current visual hallucinations reported. - No current suicidal ideation or depression stated. - Poor sleep and appetite noted.  Additional findings:      Musculoskeletal:  No abnormal movements observed      Gait & Station: Laying/Sitting      Pain Screening: Denies      Nutrition & Dental Concerns: Decrease in food intake and/or loss of appetite  RISK FORMULATION/ASSESSMENT  Is the patient experiencing any suicidal or homicidal ideations: No       Explain if yes:  Protective factors considered for safety management: include living with family (grandmother and cousin) and absence of current suicidal or homicidal ideation.  Risk factors/concerns considered for safety management: include male gender, unemployment, chronic mental health issues (schizophrenia), history of psychosis, medication noncompliance, prior inpatient admission, and history of self-harm by cutting. Additional risk factors include recent psychotic symptoms, paranoia, and auditory hallucinations. Is there a safety management plan with the patient and treatment team to minimize risk factors and promote protective factors: Yes           Explain: admit to inpatient psych Is crisis care placement or psychiatric hospitalization recommended: Yes     Based on my current evaluation and risk assessment, patient is determined at this time to be at:  Patient is acutely psychotic, high risk for psychiatric decompensation and functional impairment.  *RISK ASSESSMENT Risk assessment is a dynamic process; it is possible that this patient's condition, and risk level, may change. This should be re-evaluated and managed over time as appropriate. Please re-consult psychiatric consult services if additional assistance is needed in terms of risk assessment and management. If your team decides to discharge this patient, please advise the patient how to best access emergency psychiatric services, or to call 911, if their condition worsens or they feel unsafe in any way.   Ines Hock, NP Telepsychiatry Consult Services

## 2024-09-27 NOTE — ED Notes (Signed)
TTS finished 

## 2024-09-27 NOTE — Plan of Care (Signed)
  Problem: Activity: Goal: Sleeping patterns will improve Outcome: Progressing   Problem: Coping: Goal: Ability to demonstrate self-control will improve Outcome: Progressing   

## 2024-09-27 NOTE — ED Notes (Addendum)
 Pt admitted to [facility] adult unit, 402.

## 2024-09-27 NOTE — ED Notes (Signed)
 TTS planned to occur at 0200. Cart in room patient sitting on edge of bed waiting.  Providers messaged patient is ready.

## 2024-09-27 NOTE — ED Notes (Signed)
 Patient's breakfast tray was brought to the room.

## 2024-09-27 NOTE — [facility] Group Notes (Signed)
 Psychoeducational Group Note  Date:  09/27/2024 Time:  2000  Group Topic/Focus:  Wrap up group  Participation Level: Did Not Attend  Participation Quality:  Not Applicable  Affect:  Not Applicable  Cognitive:  Not Applicable  Insight:  Not Applicable  Engagement in Group: Not Applicable  Additional Comments:  Did not attend.   [name] 09/27/2024, 8:56 PM

## 2024-09-27 NOTE — Group Note (Signed)
 Date:  09/27/2024 Time:  6:41 PM  Group Topic/Focus:   Group used a non- competitive card game to build mindful listening skills and encourage acceptance and understanding of oneself and peers. Patients shared, fostering empathy and personal growth in a supportive environment.    Participation Level:  Did Not Attend   Bobby [name] 09/27/2024, 6:41 PM

## 2024-09-28 ENCOUNTER — Encounter (HOSPITAL_COMMUNITY): Payer: Self-pay

## 2024-09-28 DIAGNOSIS — F209 Schizophrenia, unspecified: Secondary | ICD-10-CM

## 2024-09-28 MED ORDER — VITAMIN D 25 MCG (1000 UNIT) PO TABS
1000.0000 [IU] | ORAL_TABLET | Freq: Every day | ORAL | Status: DC
  Administered 2024-09-28 – 2024-09-29 (×2): 1000 [IU] via ORAL
  Filled 2024-09-28 (×4): qty 1

## 2024-09-28 NOTE — H&P (Signed)
 Psychiatric Admission Assessment Adult  Patient Identification: Bobby [name] MRN:  [id number] Date of Evaluation:  09/28/2024 Chief Complaint:  Schizophrenia (HCC) [F20.9] Principal Diagnosis: Schizophrenia (HCC) Diagnosis:  Principal Problem:   Schizophrenia (HCC)    CC: I don't know why I am here  Bobby [name] is a 28 y.o. male  with a past psychiatric history of schizophrenia, hospitalizations in 2018 and 2019 for psychosis, no past suicide attempts. Patient initially arrived to [facility] on 09/26/2024 for involuntary commitment from grandmother for psychosis, and admitted to [facility] under IVC on 09/27/2024 for acute safety concerns, impaired functioning, intensive therapeutic interventions, and stabilization of acute on chronic psychiatric conditions.   HPI:  Patient reports that he is unaware of why he is in the hospital.  He is alert and oriented to person place and time and has intact attention but does not understand why he is here.  He does report that he is in a mental health hospital.  He cannot say that he has been in a mental health hospital for.  He is unable to describe if he has any mental health conditions and does not know if he is on any medications.  Able to say that he lives with his grandmother.  Otherwise unable to provide any pertinent HPI.  Agreeable to continuing risperidone .  He was asking about leaving the hospital and discussed with patient IVC.  Discussed LAI and he was not agreeable at this time.  Per IVC petition/affidavit: respondent has been diagnosed with schizophrenia, is prescribed medication but refuses to take it.  At age 66 he took some type of drug and that is when his schizophrenia started.  About 2 weeks ago his sister picked him up and took him with her and they smoked marijuana together.  His doctor has told him not to smoke marijuana.  On 09/24/2024 respondent took off his close and walked around outside the house for about 4 hours per videocamera.  He  is seeing people who are not there.  He asked his grandma Dealer) he who he lives with and who she is, and when he responds to grandmother, he has who are you.  He states you know I am going to kill him.  When asked who he is going to kill, he says him and point at the door and set him and no one was there.  He is cursing his grandmother and staring her down which is not normal behavior.  Petitioner fears for the safe there are spot of safety over self and others if he does not receive help.  Collateral Information, Bobby [name], grandmother, [phone]:  Reports information similar to what is stated in the IVC including patient not being on his baseline, acting bizarre, responding to internal stimuli, paranoia, threats to harm people that are not there.  Reports that he has been on the risperidone  and doing fine but then stop taking meds for about 4 months.  Asking about LAI.  Says that he can come back with her when he is stable.  Says that she will visit him tonight and that he provided her the code already.  Past Psychiatric Hx: Current Psychiatrist: [facility] per chart review Current Therapist: None Previous Psychiatric Diagnoses: Schizophrenia per chart review Psychiatric Medications: Current Risperidone  3 mg twice daily Cogentin  0.5 mg twice daily Past Per telemetry psychiatrist note chart review has been on Abilify  Maintena, Cogentin , hydroxyzine , risperidone , trazodone , tegretol  and Seroquel   Psychiatric Hospitalization hx: 2018 and 2019, similar presentation in  2019 and got better in 3 days on risperidone  3 twice daily Psychotherapy hx: None ACT team hx: None Neuromodulation history: None History of suicide: None History of homicide or aggression: None  Substance Use Hx: Patient is unable to provide this history but per chart review and grandmother used cannabis 2 weeks ago with sister.  UDS positive for cannabis.  Past Medical History: Unable to provide this  information.  No pertinent medical issues per grandmother.  Family History: family history is not on file.  No pertinent per grandmother.  Social History: Developmental: Developed schizophrenia when he was 28 years old after using an unknown substance.  Grandmother has been primary caretaker since that time. Current Living Situation: Grandmother in [place]. Education: 12th grade but did not finish high school. Occupation: On disability and not working. Hobbies: None because I am disabled  Legal: denies any upcoming court dates.  Access to firearms: Denied per patient and collateral   Total Time spent with patient: 1 hour   [place] Scale:  Flowsheet Row [facility] 09/26/2024 in [facility] (Discharged[facility]  C-SSRS RISK CATEGORY No Risk No Risk No Risk     Lab Results:  Results for orders placed or performed during the hospital encounter of 09/26/24 (from the past 48 hours)  Urine rapid drug screen (hosp performed)     Status: Abnormal   Collection Time: 09/26/24  5:31 PM  Result Value Ref Range   Opiates NEGATIVE NEGATIVE   Cocaine NEGATIVE NEGATIVE   Benzodiazepines NEGATIVE NEGATIVE   Amphetamines NEGATIVE NEGATIVE   Tetrahydrocannabinol POSITIVE (A) NEGATIVE   Barbiturates NEGATIVE NEGATIVE   Methadone Scn, Ur NEGATIVE NEGATIVE   Fentanyl  NEGATIVE NEGATIVE    Comment: (NOTE) Drug screen is for Medical Purposes only. Positive results are preliminary only. If confirmation is needed, notify lab within 5 days.  Drug Class                 Cutoff (ng/mL) Amphetamine and metabolites 1000 Barbiturate and metabolites 200 Benzodiazepine              200 Opiates and metabolites     300 Cocaine and metabolites     300 THC                         50 Fentanyl                      5 Methadone                   300  Trazodone  is metabolized in vivo to several metabolites,  including pharmacologically active m-CPP, which is excreted in the  urine.  Immunoassay screens for amphetamines and MDMA have potential  cross-reactivity with these compounds and may provide false positive  result.  Performed at [facility], [address], [place], [place] [address]   Comprehensive metabolic panel     Status: Abnormal   Collection Time: 09/26/24  5:32 PM  Result Value Ref Range   Sodium 140 135 - 145 mmol/L   Potassium 3.9 3.5 - 5.1 mmol/L   Chloride 105 98 - 111 mmol/L   CO2 22 22 - 32 mmol/L   Glucose, Bld 85 70 - 99 mg/dL    Comment: Glucose reference range applies only to samples taken after  fasting for at least 8 hours.   BUN 5 (L) 6 - 20 mg/dL   Creatinine, Ser 8.87 0.61 - 1.24 mg/dL   Calcium 9.4 8.9 - 89.6 mg/dL   Total Protein 7.2 6.5 - 8.1 g/dL   Albumin 4.1 3.5 - 5.0 g/dL   AST 21 15 - 41 U/L   ALT 25 0 - 44 U/L   Alkaline Phosphatase 76 38 - 126 U/L   Total Bilirubin 0.3 0.0 - 1.2 mg/dL   GFR, Estimated >39 >39 mL/min    Comment: (NOTE) Calculated using the CKD-EPI Creatinine Equation (2021)    Anion gap 13 5 - 15    Comment: Performed at [facility], [address], [place], [place] [address]  Ethanol     Status: None   Collection Time: 09/26/24  5:32 PM  Result Value Ref Range   Alcohol, Ethyl (B) <15 <15 mg/dL    Comment: (NOTE) For medical purposes only. Performed at [facility], [address], [place], [place] [address]   CBC with Diff     Status: None   Collection Time: 09/26/24  5:32 PM  Result Value Ref Range   WBC 6.3 4.0 - 10.5 K/uL   RBC 4.71 4.22 - 5.81 MIL/uL   Hemoglobin 13.0 13.0 - 17.0 g/dL   HCT 59.9 60.9 - 47.9 %   MCV 84.9 80.0 - 100.0 fL   MCH 27.6 26.0 - 34.0 pg   MCHC 32.5 30.0 - 36.0 g/dL   RDW 86.3 88.4 - 84.4 %   Platelets 322 150 - 400 K/uL   nRBC 0.0 0.0 - 0.2 %   Neutrophils Relative % 57 %   Neutro Abs 3.6  1.7 - 7.7 K/uL   Lymphocytes Relative 30 %   Lymphs Abs 1.9 0.7 - 4.0 K/uL   Monocytes Relative 9 %   Monocytes Absolute 0.6 0.1 - 1.0 K/uL   Eosinophils Relative 3 %   Eosinophils Absolute 0.2 0.0 - 0.5 K/uL   Basophils Relative 1 %   Basophils Absolute 0.1 0.0 - 0.1 K/uL   Immature Granulocytes 0 %   Abs Immature Granulocytes 0.01 0.00 - 0.07 K/uL    Comment: Performed at [facility], [address], [place], [place] [address]  Salicylate level     Status: Abnormal   Collection Time: 09/26/24  5:32 PM  Result Value Ref Range   Salicylate Lvl <7.0 (L) 7.0 - 30.0 mg/dL    Comment: Performed at [facility], [address], [place], [place] [address]  Acetaminophen  level     Status: Abnormal   Collection Time: 09/26/24  5:32 PM  Result Value Ref Range   Acetaminophen  (Tylenol ), Serum <10 (L) 10 - 30 ug/mL    Comment: (NOTE) Toxic concentrations can be more effectively related to post dose interval; > 200, > 100, and > 50 ug/mL serum concentrations correspond to toxic concentrations at 4, 8, and 12 hours post dose, respectively.  Performed at [facility], [address] [place], [place] [address]     Blood Alcohol level:  Lab Results  Component Value Date   [facility] <15 09/26/2024   ETH <10 12/01/2018    Metabolic Disorder Labs:  See assessment and plan.      Psychiatric Specialty Exam:  Presentation  General Appearance: Disheveled  Eye Contact:Fair  Speech:Slow (Thought blocking)  Speech Volume:Decreased  Handedness:No data recorded  Mood and Affect  Mood:-- (difficult in expressing)  Affect:Blunt   Thought Process  Thought Processes:Disorganized (Can  answer short responses, sometimes answers do not make sense, sometimes reasoning is not logical)  Descriptions of Associations:Loose  Orientation:Full (Time, Place and Person)  Thought Content:-- (Sure responses are logical but other times makes very illogical associations.  Does not describe any obvious  delusions but is not speaking much.  Denies AVH.  Hesitant when asked about paranoid ideation.)  History of Schizophrenia/Schizoaffective disorder: Since age 7  Hallucinations:Hallucinations: -- (No obvious responding to internal stimuli but is thought blocking)  Ideas of Reference:None  Suicidal Thoughts:Suicidal Thoughts: No  Homicidal Thoughts:Homicidal Thoughts: No   Sensorium  Memory:Immediate Poor; Recent Poor; Remote Fair (Unable to remember what brought to the hospital also has issues with memory testing.  Recalls 3 of 3 objects immediately but only recalls 1 out of 3 objects unprompted and 5 minutes.  Recalls another object with prompting.)  Judgment:No data recorded Insight:Poor   Executive Functions  Concentration:Fair  Attention Span:Fair  Recall:Poor  Fund of Knowledge:Poor  Language:Fair   Psychomotor Activity  Psychomotor Activity:Psychomotor Activity: Decreased (No EPS.)   Assets  Assets:Desire for Improvement; Housing; Social Support   Sleep  Sleep:Sleep: Poor (Subjectively per patient)  Estimated Sleeping Duration (Last 24 Hours): 8.00-9.50 hours  Physical Exam: Physical Exam Vitals and nursing note reviewed.  HENT:     Head: Normocephalic and atraumatic.  Pulmonary:     Effort: Pulmonary effort is normal.  Neurological:     General: No focal deficit present.     Mental Status: He is alert.  Psychiatric:     Comments: No obvious EPS.    Review of Systems  Constitutional:  Negative for fever.  Cardiovascular:  Negative for chest pain and palpitations.  Gastrointestinal:  Negative for constipation, diarrhea, nausea and vomiting.  Neurological:  Negative for dizziness, weakness and headaches.  Psychiatric/Behavioral:         Pt denies extrapyramidal symptoms including dystonia (sudden spastic contractions of muscle groups), parkinsonism (bradykinesia, tremors, rigidity), and akathisia (severe restlessness).    Blood pressure  125/81, pulse 86, temperature 98.2 F (36.8 C), temperature source Oral, resp. rate 18, height 5' 9 (1.753 m), weight 110 kg, SpO2 100%. Body mass index is 35.83 kg/m.   Treatment Plan Summary: Daily contact with patient to assess and evaluate symptoms and progress in treatment and Medication management   ASSESSMENT & PLAN  ASSESSMENT:   Diagnoses / Active Problems:  Principal Problem:   Schizophrenia (HCC)    Bobby [name] is a 28 y.o. male with history of schizophrenia, hospitalizations for psychosis in 2018 and 2019, similar presentation in 2019 specifically, no past suicide attempts who presents with disorganization from psychosis that is likely impairing his functioning and concerns for the safety of himself and others due to concerns stated in IVC per grandmother.  Triggers for current episode include medication nonadherence for the past 4 months and using cannabis x 1 with sister 2 weeks ago per grandmother.  Patient reports that the medicine makes him tired when it is not time to go to sleep but was discussed with him that risperidone  is one of the better options for less sedating.  Patient was agreeable to continuing risperidone  given we know it works for him and can consider cross titration outpatient and given there is a long-acting injectable option which grandmother is wanting patient to get and may be a stipulation for him returning home.  Patient had been started on 3 mg twice daily by the telepsychiatrist prior to admission which is risky for DC EPS but  since he has already had several doses and is tolerating without side effects and is on benztropine  we will continue to monitor his dose and consider cutting back with any concerns for EPS.  Will discuss LAI's patient mentation improves as currently his memory is impaired and is having some thought blocking which may be affecting his attention.  Continuing IVC as patient is asking to leave but is clearly grossly impaired by his  mental illness.  Patient has well-documented history of schizophrenia so do not suspect substance-induced psychosis especially given he lives with his grandmother and would not have issues with obtaining substances based on his functioning and being in her controlled setting although did use cannabis with his sister which seem to trigger episode.  A1c is normal and lipid panel was done in February which showed hyperlipidemia further suggesting that patient would benefit from risperidone  versus another agent but patient may benefit in future from cholesterol-lowering agent such as statin.  Will add RPR and HIV although suspect patient has not been sexually active given his functioning but has not been tested for in the past it seems.  Has not had CT head per chart but would defer this with unremarkable neurologic exam which would have suspected by now if was space-occupying lesion presentation with several years ago as well. Suspect he will take 5-7 days to improve given severity and will use grandma to gauge when he is closer to his baseline.    PLAN: Safety and Monitoring:             -- Involuntary admission to inpatient psychiatric unit for safety, stabilization and treatment             -- Daily contact with patient to assess and evaluate symptoms and progress in treatment             -- Patient's case to be discussed in multi-disciplinary team meeting             -- Observation Level : q15 minute checks             -- Vital signs:  q12 hours             -- Precautions: suicide, elopement, and assault   2. Psychiatric Treatment:  -- Continue home risperidone  3 mg twice daily for schizophrenia (started PTA despite nonadherence and has gotten 2+ doses w/o EPS) -- Continue home Cogentin  0.5 mg twice daily for EPS prophylaxis   -- Continue trazodone  50 mg once nightly as needed for insomnia  -- Continue hydroxyzine  25 mg 3 times daily as needed for anxiety  --  The  risks/benefits/side-effects/alternatives to this medication were discussed in detail with the patient and time was given for questions. The patient consents to medication trial.              -- Metabolic profile and EKG monitoring obtained while on an atypical antipsychotic. See #4 below for values.              -- Encouraged patient to participate in unit milieu and in scheduled group therapies              -- Short Term Goals: Ability to identify changes in lifestyle to reduce recurrence of condition will improve, Ability to verbalize feelings will improve, Ability to disclose and discuss suicidal ideas, Ability to demonstrate self-control will improve, Ability to identify and develop effective coping behaviors will improve, Ability to maintain clinical measurements within normal limits will improve, Compliance  with prescribed medications will improve, and Ability to identify triggers associated with substance abuse/mental health issues will improve             -- Long Term Goals: Improvement in symptoms so as ready for discharge                3. Medical Issues Being Addressed:              -- Mixed hyperlipidemia: Consider statin in future and nutritionist for diet optimization and encourage exercise    -- Continue PRN's: Tylenol , Maalox, Milk of Magnesia   4. Routine and other pertinent labs reviewed: EKG monitoring: QTc: 396  BMI: Body mass index is 35.83 kg/m. Prolactin: Lab Results  Component Value Date   PROLACTIN 51.8 (H) 07/18/2017   PROLACTIN 36.8 (H) 07/17/2017    Lipid Panel: Lab Results  Component Value Date   CHOL 203 (H) 01/30/2024   TRIG 246 (H) 01/30/2024   HDL 32 (L) 01/30/2024   CHOLHDL 6.3 (H) 01/30/2024   VLDL 25 07/18/2017   LDLCALC 127 (H) 01/30/2024   LDLCALC 117 (H) 01/24/2023    HbgA1c: HB A1C (BAYER DCA - WAIVED) (%)  Date Value  08/27/2024 5.6    TSH: TSH (uIU/mL)  Date Value  01/30/2024 1.130    Labs to order: RPR, HIV for medical rule  out of psychosis  5. Discharge Planning:              -- Social work and case management to assist with discharge planning and identification of hospital follow-up needs prior to discharge             -- Estimated LOS: 10/13             -- Discharge Concerns: Need to establish a safety plan; Medication compliance and effectiveness             -- Discharge Goals: Return home with outpatient referrals for mental health follow-up including medication management/psychotherapy    I certify that inpatient services furnished can reasonably be expected to improve the patient's condition.     Justino Cornish, MD PGY-2 Psychiatry Resident 09/28/2024, 2:28 PM

## 2024-09-28 NOTE — Group Note (Signed)
 Date:  09/28/2024 Time:  9:56 AM  Group Topic/Focus:  Goals Group:   The focus of this group is to help patients establish daily goals to achieve during treatment and discuss how the patient can incorporate goal setting into their daily lives to aide in recovery.    Participation Level:  Did Not Attend   Bobby [name] 09/28/2024, 9:56 AM

## 2024-09-28 NOTE — Progress Notes (Signed)
 Tour of Duty:  Prentice JINNY Angle, RN, 09/28/24, Tour of Duty: 0700-1900  SI/HI/AVH: Denies  Self-Reported   Mood: Neutral  Anxiety: Denies Depression: Denies, but Observable Irritability: Denies  Broset  Violence Prevention Guidelines *See Row Information*: (not recorded)   LBM  Last BM Date : 09/28/24   Pain: present, PRN provided (see MAR)  Patient Refusals (including Rx): No  >>Shift Summary: Patient observed to be calm on unit but generally isolative to room. Patient able to make needs known. Patient observed to engage appropriately with staff and peers. Patient taking medications as prescribed. This shift, no PRN medication requested or required. No reported or observed side effects to medication. No reported or observed agitation, aggression, or other acute emotional distress. Patient reports severe pain to R thumb from playing basketball yesterday, no redness, edema, or impaired ROM noted; PRN provided with positive effect, LP made aware. No additional reported or observed physical abnormalities or concerns.   Last Vitals  Vitals Weight: 110 kg Temp: 98.2 F (36.8 C) Temp Source: Oral Pulse Rate: (!) 101 Resp: 18 BP: 127/64 Patient Position: (not recorded)  Admission Type  Psych Admission Type (Psych Patients Only) Admission Status: Involuntary Date 72 hour document signed : (not recorded) Time 72 hour document signed : (not recorded) Provider Notified (First and Last Name) (see details for LINK to note): (not recorded)   Psychosocial Assessment  Psychosocial Assessment Patient Complaints: None Eye Contact: Fair Facial Expression: Flat Affect: Flat Speech: Soft Interaction: Childlike, Guarded Motor Activity: Slow Appearance/Hygiene: Unremarkable Behavior Characteristics: Unwilling to participate Mood: Pleasant   Aggressive Behavior  Targets: (not recorded)   Thought Process  Thought Process Coherency: Within Defined Limits Content: Within  Defined Limits Delusions: None reported or observed Perception: Within Defined Limits Hallucination: None reported or observed Judgment: Limited Confusion: None  Danger to Self/Others  Danger to Self Current suicidal ideation?: Denies Description of Suicide Plan: (not recorded) Self-Injurious Behavior: (not recorded) Agreement Not to Harm Self: (not recorded) Description of Agreement: (not recorded) Danger to Others: None reported or observed

## 2024-09-28 NOTE — BH IP Treatment Plan (Signed)
 Interdisciplinary Treatment and Diagnostic Plan Update  09/28/2024 Time of Session: 10:45AM JAECE DUCHARME MRN: [id number]  Principal Diagnosis: Schizophrenia [facility])  Secondary Diagnoses: Principal Problem:   Schizophrenia (HCC)   Current Medications:  Current Facility-Administered Medications  Medication Dose Route Frequency Provider Last Rate Last Admin   acetaminophen  (TYLENOL ) tablet 650 mg  650 mg Oral Q6H PRN Kline, Kyra A, NP   650 mg at 09/28/24 1145   albuterol  (PROVENTIL ) (2.5 MG/3ML) 0.083% nebulizer solution 3 mL  3 mL Inhalation Q6H PRN Kline, Kyra A, NP       alum & mag hydroxide-simeth (MAALOX/MYLANTA) 200-200-20 MG/5ML suspension 30 mL  30 mL Oral Q4H PRN Kline, Kyra A, NP       benztropine  (COGENTIN ) tablet 0.5 mg  0.5 mg Oral BID Kline, Kyra A, NP   0.5 mg at 09/28/24 1144   cholecalciferol (VITAMIN D3) 25 MCG (1000 UNIT) tablet 1,000 Units  1,000 Units Oral Daily Towana Leita SAILOR, MD   1,000 Units at 09/28/24 1144   haloperidol  (HALDOL ) tablet 5 mg  5 mg Oral TID PRN Kline, Kyra A, NP       And   diphenhydrAMINE (BENADRYL) capsule 50 mg  50 mg Oral TID PRN Kline, Kyra A, NP       haloperidol  lactate (HALDOL ) injection 5 mg  5 mg Intramuscular TID PRN Kline, Kyra A, NP       And   diphenhydrAMINE (BENADRYL) injection 50 mg  50 mg Intramuscular TID PRN Kline, Kyra A, NP       And   LORazepam  (ATIVAN ) injection 2 mg  2 mg Intramuscular TID PRN Kline, Kyra A, NP       haloperidol  lactate (HALDOL ) injection 10 mg  10 mg Intramuscular TID PRN Kline, Kyra A, NP       And   diphenhydrAMINE (BENADRYL) injection 50 mg  50 mg Intramuscular TID PRN Kline, Kyra A, NP       And   LORazepam  (ATIVAN ) injection 2 mg  2 mg Intramuscular TID PRN Kline, Kyra A, NP       hydrOXYzine  (ATARAX ) tablet 25 mg  25 mg Oral TID PRN Kline, Kyra A, NP       magnesium  hydroxide (MILK OF MAGNESIA) suspension 30 mL  30 mL Oral Daily PRN Kline, Kyra A, NP       ondansetron  (ZOFRAN ) tablet 4 mg  4 mg  Oral Q8H PRN Kline, Kyra A, NP       risperiDONE  (RISPERDAL ) tablet 3 mg  3 mg Oral BID Kline, Kyra A, NP   3 mg at 09/28/24 1144   traZODone  (DESYREL ) tablet 50 mg  50 mg Oral QHS PRN Kline, Kyra A, NP       PTA Medications: Medications Prior to Admission  Medication Sig Dispense Refill Last Dose/Taking   albuterol  (VENTOLIN  HFA) 108 (90 Base) MCG/ACT inhaler Inhale 2 puffs into the lungs every 6 (six) hours as needed for wheezing or shortness of breath. 18 g 5 Unknown   benztropine  (COGENTIN ) 0.5 MG tablet Take 1 tablet (0.5 mg total) by mouth 2 (two) times daily. For prevention of drug induced tremors (Patient not taking: Reported on 09/28/2024) 60 tablet 0 Not Taking   Cholecalciferol (VITAMIN D -3) 125 MCG (5000 UT) TABS Take 125 mcg by mouth daily. (Patient not taking: Reported on 09/28/2024)   Not Taking   hydrocortisone  (ANUSOL -HC) 2.5 % rectal cream Place 1 application rectally 2 (two) times daily. (Patient not taking: Reported  on 09/28/2024) 30 g 0 Not Taking   hydrOXYzine  (ATARAX /VISTARIL ) 25 MG tablet Take 1 tablet (25 mg total) by mouth 3 (three) times daily as needed for anxiety. (Patient not taking: Reported on 09/28/2024) 60 tablet 0 Not Taking   risperiDONE  (RISPERDAL ) 3 MG tablet Take 3 mg by mouth 2 (two) times daily. (Patient not taking: Reported on 09/28/2024)   Not Taking   traZODone  (DESYREL ) 50 MG tablet Take 1 tablet (50 mg total) by mouth at bedtime as needed for sleep. (Patient not taking: Reported on 09/28/2024) 30 tablet 0 Not Taking    Patient Stressors:    Patient Strengths:    Treatment Modalities: Medication Management, Group therapy, Case management,  1 to 1 session with clinician, Psychoeducation, Recreational therapy.   Physician Treatment Plan for Primary Diagnosis: Schizophrenia (HCC) Long Term Goal(s):     Short Term Goals:    Medication Management: Evaluate patient's response, side effects, and tolerance of medication regimen.  Therapeutic  Interventions: 1 to 1 sessions, Unit Group sessions and Medication administration.  Evaluation of Outcomes: Not Progressing  Physician Treatment Plan for Secondary Diagnosis: Principal Problem:   Schizophrenia (HCC)  Long Term Goal(s):     Short Term Goals:       Medication Management: Evaluate patient's response, side effects, and tolerance of medication regimen.  Therapeutic Interventions: 1 to 1 sessions, Unit Group sessions and Medication administration.  Evaluation of Outcomes: Not Progressing   RN Treatment Plan for Primary Diagnosis: Schizophrenia (HCC) Long Term Goal(s): Knowledge of disease and therapeutic regimen to maintain health will improve  Short Term Goals: Ability to remain free from injury will improve, Ability to verbalize frustration and anger appropriately will improve, Ability to demonstrate self-control, Ability to participate in decision making will improve, Ability to verbalize feelings will improve, and Ability to disclose and discuss suicidal ideas  Medication Management: RN will administer medications as ordered by provider, will assess and evaluate patient's response and provide education to patient for prescribed medication. RN will report any adverse and/or side effects to prescribing provider.  Therapeutic Interventions: 1 on 1 counseling sessions, Psychoeducation, Medication administration, Evaluate responses to treatment, Monitor vital signs and CBGs as ordered, Perform/monitor CIWA, COWS, AIMS and Fall Risk screenings as ordered, Perform wound care treatments as ordered.  Evaluation of Outcomes: Not Progressing   LCSW Treatment Plan for Primary Diagnosis: Schizophrenia (HCC) Long Term Goal(s): Safe transition to appropriate next level of care at discharge, Engage patient in therapeutic group addressing interpersonal concerns.  Short Term Goals: Engage patient in aftercare planning with referrals and resources, Increase social support, Increase  ability to appropriately verbalize feelings, Increase emotional regulation, Facilitate acceptance of mental health diagnosis and concerns, and Increase skills for wellness and recovery  Therapeutic Interventions: Assess for all discharge needs, 1 to 1 time with Social worker, Explore available resources and support systems, Assess for adequacy in community support network, Educate family and significant other(s) on suicide prevention, Complete Psychosocial Assessment, Interpersonal group therapy.  Evaluation of Outcomes: Not Progressing   Progress in Treatment: Attending groups: No. Participating in groups: No. Taking medication as prescribed: Yes. Toleration medication: Yes. Family/Significant other contact made: No, will contact:  family/friends once consent is given.  Patient understands diagnosis: Yes. Discussing patient identified problems/goals with staff: Yes. Medical problems stabilized or resolved: Yes. Denies suicidal/homicidal ideation: Yes. Issues/concerns per patient self-inventory: No.  Patient Goals:  none that I know of  Discharge Plan or Barriers: Patient likely to discharge home once stable.  Reason for Continuation of Hospitalization: Delusions  Hallucinations Medication stabilization  Estimated Length of Stay: 5-7 days  Last 3 [place] Suicide Severity Risk Score: Flowsheet Row [facility]/2024 in [facility] (Dis[facility]) [facility]  C-SSRS RISK CATEGORY No Risk No Risk No Risk    Last PHQ 2/9 Scores:    08/27/2024   10:04 AM 01/30/2024    9:49 AM 07/30/2023   10:37 AM  Depression screen PHQ 2/9  Decreased Interest 0 0 3  Down, Depressed, Hopeless 0 0 3  PHQ - 2 Score 0 0 6  Altered sleeping 0 0 0  Tired, decreased energy 0 0 1  Change in appetite 0 0 1  Feeling  bad or failure about yourself  0 0 3  Trouble concentrating 0 0 0  Moving slowly or fidgety/restless 0 0 0  Suicidal thoughts 0 0 0  PHQ-9 Score 0 0 11  Difficult doing work/chores Not difficult at all Not difficult at all Extremely dIfficult    Scribe for Treatment Team: Adaira Centola M Yukari Flax, ISRA[place] 09/28/2024 4:21 PM

## 2024-09-28 NOTE — Group Note (Signed)
 Recreation Therapy Group Note   Group Topic:Stress Management  Group Date: 09/28/2024 Start Time: 0930 End Time: 0950 Facilitators: Brylin Stanislawski-McCall, LRT,CTRS Location: [address] Dayroom   Group Topic: Stress Management   Goal Area(s) Addresses:  Patient will actively participate in stress management techniques presented during session.  Patient will successfully identify benefit of practicing stress management post d/c.   Behavioral Response:   Intervention: Relaxation exercise with ambient sound and script   Activity: Guided Imagery. LRT provided education, instruction, and demonstration on practice of visualization via guided imagery. Patient was asked to participate in the technique introduced during session. LRT debriefed including topics of mindfulness, stress management and specific scenarios each patient could use these techniques. Patients were given suggestions of ways to access scripts post d/c and encouraged to explore Youtube and other apps available on smartphones, tablets, and computers.  Education:  Stress Management, Discharge Planning.   Education Outcome: Acknowledges education   Affect/Mood: N/A   Participation Level: Did not attend    Clinical Observations/Individualized Feedback:     Plan: Continue to engage patient in RT group sessions 2-3x/week.   Erica Richwine-McCall, LRT,CTRS 09/28/2024 12:15 PM

## 2024-09-28 NOTE — Plan of Care (Signed)
   Problem: Education: Goal: Emotional status will improve Outcome: Progressing Goal: Mental status will improve Outcome: Progressing

## 2024-09-28 NOTE — Group Note (Signed)
 Date:  09/28/2024 Time:  3:05 PM  Group Topic/Focus:  Emotional Education:   The focus of this group is to discuss what feelings/emotions are, and how they are experienced. Physical Wellness Education: To educate participants on the principles of physical wellness, promote healthy lifestyle habits, and encourage regular physical activity to improve overall physical health, energy levels, and quality of life.   Participation Level:  Did Not Attend   Bobby [name] Bobby [name] 09/28/2024, 3:05 PM

## 2024-09-28 NOTE — [facility] Suicide Risk Assessment (Signed)
 Suicide Risk Assessment  Admission Assessment    [facility] Admission Suicide Risk Assessment   Nursing information obtained from:  Patient Demographic factors:  Male Current Mental Status:  NA Loss Factors:  NA Historical Factors:  NA Risk Reduction Factors:  Living with another person, especially a relative, Positive social support  Total Time spent with patient: 1 hour Principal Problem: Schizophrenia (HCC) Diagnosis:  Principal Problem:   Schizophrenia (HCC)  Subjective Data: see H&P  Continued Clinical Symptoms:  Alcohol Use Disorder Identification Test Final Score (AUDIT): 0 The Alcohol Use Disorders Identification Test, Guidelines for Use in Primary Care, Second Edition.  World Science writer [facility]). Score between 0-7:  no or low risk or alcohol related problems. Score between 8-15:  moderate risk of alcohol related problems. Score between 16-19:  high risk of alcohol related problems. Score 20 or above:  warrants further diagnostic evaluation for alcohol dependence and treatment.   CLINICAL FACTORS:   Schizophrenia:   Command hallucinatons Less than 54 years old Paranoid or undifferentiated type   Musculoskeletal: Strength & Muscle Tone: within normal limits Gait & Station: normal Patient leans: N/A  Psychiatric Specialty Exam:  Presentation  General Appearance:  Disheveled  Eye Contact: Fair  Speech: Slow (Thought blocking)  Speech Volume: Decreased  Handedness:No data recorded  Mood and Affect  Mood: -- (difficult in expressing)  Affect: Blunt   Thought Process  Thought Processes: Disorganized (Can answer short responses, sometimes answers do not make sense, sometimes reasoning is not logical)  Descriptions of Associations:Loose  Orientation:Full (Time, Place and Person)  Thought Content:-- (Sure responses are logical but other times makes very illogical associations.  Does not describe any obvious delusions but is not speaking much.   Denies AVH.  Hesitant when asked about paranoid ideation.)  History of Schizophrenia/Schizoaffective disorder:No data recorded Duration of Psychotic Symptoms:No data recorded Hallucinations:Hallucinations: -- (No obvious responding to internal stimuli but is thought blocking)  Ideas of Reference:None  Suicidal Thoughts:Suicidal Thoughts: No  Homicidal Thoughts:Homicidal Thoughts: No   Sensorium  Memory: Immediate Poor; Recent Poor; Remote Fair (Unable to remember what brought to the hospital also has issues with memory testing.  Recalls 3 of 3 objects immediately but only recalls 1 out of 3 objects unprompted and 5 minutes.  Recalls another object with prompting.)  Judgment:No data recorded Insight: Poor   Executive Functions  Concentration: Fair  Attention Span: Fair  Recall: Poor  Fund of Knowledge: Poor  Language: Fair   Psychomotor Activity  Psychomotor Activity: Psychomotor Activity: Decreased (No EPS.)   Assets  Assets: Desire for Improvement; Housing; Social Support   Sleep  Sleep: Sleep: Poor (Subjectively per patient)    Physical Exam: Physical Exam Vitals and nursing note reviewed.  HENT:     Head: Normocephalic and atraumatic.  Pulmonary:     Effort: Pulmonary effort is normal.  Neurological:     General: No focal deficit present.     Mental Status: He is alert.  Psychiatric:     Comments: No obvious EPS.    Review of Systems  Constitutional:  Negative for fever.  Cardiovascular:  Negative for chest pain and palpitations.  Gastrointestinal:  Negative for constipation, diarrhea, nausea and vomiting.  Neurological:  Negative for dizziness, weakness and headaches.  Psychiatric/Behavioral:         Pt denies extrapyramidal symptoms including dystonia (sudden spastic contractions of muscle groups), parkinsonism (bradykinesia, tremors, rigidity), and akathisia (severe restlessness).    Blood pressure 125/81, pulse 86, temperature 98.2  F (  36.8 C), temperature source Oral, resp. rate 18, height 5' 9 (1.753 m), weight 110 kg, SpO2 100%. Body mass index is 35.83 kg/m.   COGNITIVE FEATURES THAT CONTRIBUTE TO RISK:  Loss of executive function    SUICIDE RISK:   Minimal: No identifiable suicidal ideation.  No depressed symptomatology.  However this risk assessment is limited by patient's psychosis and he does have risk factors for chronic suicide risk including his schizophrenia diagnosis which may be paranoid in nature with command auditory hallucinations and being someone younger than age 64 with progressive disease.  PLAN OF CARE: see H&P  I certify that inpatient services furnished can reasonably be expected to improve the patient's condition.   Justino Cornish, MD 09/28/2024, 2:30 PM

## 2024-09-28 NOTE — Progress Notes (Signed)
   09/28/24 2200  Psych Admission Type (Psych Patients Only)  Admission Status Involuntary  Psychosocial Assessment  Patient Complaints None  Eye Contact Fair  Facial Expression Flat  Affect Flat  Speech Soft  Interaction Childlike  Motor Activity Slow  Appearance/Hygiene Unremarkable  Behavior Characteristics Cooperative  Mood Pleasant  Thought Process  Coherency WDL  Content WDL  Delusions None reported or observed  Perception WDL  Hallucination None reported or observed  Judgment Limited  Confusion None  Danger to Self  Current suicidal ideation? Denies  Agreement Not to Harm Self Yes  Description of Agreement Verbal  Danger to Others  Danger to Others None reported or observed

## 2024-09-28 NOTE — [facility] Group Notes (Signed)
 [facility] Group Notes:  (Nursing/MHT/Case Management/Adjunct)  Date:  09/28/2024  Time:  8:44 PM  Type of Therapy:  Wrap-up group  Participation Level:  Active  Participation Quality:  Appropriate  Affect:  Appropriate  Cognitive:  Appropriate  Insight:  Appropriate  Engagement in Group:  Engaged  Modes of Intervention:  Education  Summary of Progress/Problems: Goal to go home. Rated his day 10/10.  Grayce LITTIE Essex 09/28/2024, 8:44 PM

## 2024-09-28 NOTE — Progress Notes (Signed)
(  Sleep Hours) -6.75 (Any PRNs that were needed, meds refused, or side effects to meds)- none (Any disturbances and when (visitation, over night)-none (Concerns raised by the patient)-  (SI/HI/AVH)-denies all

## 2024-09-29 NOTE — Progress Notes (Signed)
 [facility] MD Progress Note  09/29/2024 12:54 PM Bobby [name]  MRN:  [id number]  Principal Problem: Schizophrenia [facility]) Diagnosis: Principal Problem:   Schizophrenia (HCC)   Reason for Admission:  Bobby [name] is a 28 y.o. male  with a past psychiatric history of schizophrenia, hospitalizations in 2018 and 2019 for psychosis, no past suicide attempts. Patient initially arrived to [facility] on 09/26/2024 for involuntary commitment from grandmother for psychosis, and admitted to [facility] under IVC on 09/27/2024 for acute safety concerns, impaired functioning, intensive therapeutic interventions, and stabilization of acute on chronic psychiatric conditions. (admitted on 09/27/2024, total  LOS: 2 days )   last 24 hours:  Slept 12 hours. Not attending groups. Tylenol  x1 yesterday. Denies SI/HI/AVH. Isolating in room. Taking his risperidone . Reports pain in R thumb from playing basketball yesterday.    Information Obtained Today During Patient Interview:    Patient denies suicidal and homicidal thoughts. Patient denies auditory hallucinations, visual hallucinations, and paranoia.    Past Psychiatric Hx: Current Psychiatrist: [facility] per chart review Current Therapist: None Previous Psychiatric Diagnoses: Schizophrenia per chart review Psychiatric Medications: Current Risperidone  3 mg twice daily Cogentin  0.5 mg twice daily Past Per telemetry psychiatrist note chart review has been on Abilify  Maintena, Cogentin , hydroxyzine , risperidone , trazodone , tegretol  and Seroquel   Psychiatric Hospitalization hx: 2018 and 2019, similar presentation in 2019 and got better in 3 days on risperidone  3 twice daily Psychotherapy hx: None ACT team hx: None Neuromodulation history: None History of suicide: None History of homicide or aggression: None   Substance Use Hx: Patient is unable to provide this history but per chart review and grandmother used cannabis 2 weeks ago with sister.  UDS positive for  cannabis.   Past Medical History: Unable to provide this information.  No pertinent medical issues per grandmother.   Family History: family history is not on file.  No pertinent per grandmother.   Social History: Developmental: Developed schizophrenia when he was 28 years old after using an unknown substance.  Grandmother has been primary caretaker since that time. Current Living Situation: Grandmother in [place]. Education: 12th grade but did not finish high school. Occupation: On disability and not working. Hobbies: None because I am disabled   Legal: denies any upcoming court dates.  Access to firearms: Denied per patient and collateral   Current Medications: Current Facility-Administered Medications  Medication Dose Route Frequency Provider Last Rate Last Admin   acetaminophen  (TYLENOL ) tablet 650 mg  650 mg Oral Q6H PRN Kline, Kyra A, NP   650 mg at 09/29/24 1204   albuterol  (PROVENTIL ) (2.5 MG/3ML) 0.083% nebulizer solution 3 mL  3 mL Inhalation Q6H PRN Kline, Kyra A, NP       alum & mag hydroxide-simeth (MAALOX/MYLANTA) 200-200-20 MG/5ML suspension 30 mL  30 mL Oral Q4H PRN Kline, Kyra A, NP       benztropine  (COGENTIN ) tablet 0.5 mg  0.5 mg Oral BID Kline, Kyra A, NP   0.5 mg at 09/29/24 1203   cholecalciferol (VITAMIN D3) 25 MCG (1000 UNIT) tablet 1,000 Units  1,000 Units Oral Daily Towana Leita SAILOR, MD   1,000 Units at 09/29/24 1203   haloperidol  (HALDOL ) tablet 5 mg  5 mg Oral TID PRN Kline, Kyra A, NP       And   diphenhydrAMINE (BENADRYL) capsule 50 mg  50 mg Oral TID PRN Kline, Kyra A, NP       haloperidol  lactate (HALDOL ) injection 5 mg  5 mg Intramuscular TID PRN Beverli,  Kyra A, NP       And   diphenhydrAMINE (BENADRYL) injection 50 mg  50 mg Intramuscular TID PRN Kline, Kyra A, NP       And   LORazepam  (ATIVAN ) injection 2 mg  2 mg Intramuscular TID PRN Kline, Kyra A, NP       haloperidol  lactate (HALDOL ) injection 10 mg  10 mg Intramuscular TID PRN Kline, Kyra  A, NP       And   diphenhydrAMINE (BENADRYL) injection 50 mg  50 mg Intramuscular TID PRN Kline, Kyra A, NP       And   LORazepam  (ATIVAN ) injection 2 mg  2 mg Intramuscular TID PRN Kline, Kyra A, NP       hydrOXYzine  (ATARAX ) tablet 25 mg  25 mg Oral TID PRN Kline, Kyra A, NP       magnesium  hydroxide (MILK OF MAGNESIA) suspension 30 mL  30 mL Oral Daily PRN Kline, Kyra A, NP       ondansetron  (ZOFRAN ) tablet 4 mg  4 mg Oral Q8H PRN Kline, Kyra A, NP       risperiDONE  (RISPERDAL ) tablet 3 mg  3 mg Oral BID Kline, Kyra A, NP   3 mg at 09/29/24 1203   traZODone  (DESYREL ) tablet 50 mg  50 mg Oral QHS PRN Kline, Kyra A, NP        Lab Results: No results found for this or any previous visit (from the past 48 hours).  Blood Alcohol level:  Lab Results  Component Value Date   [facility] <15 09/26/2024   ETH <10 12/01/2018    Metabolic Labs: Lab Results  Component Value Date   HGBA1C 5.6 08/27/2024   MPG 111 07/18/2017   MPG 103 07/17/2017   Lab Results  Component Value Date   PROLACTIN 51.8 (H) 07/18/2017   PROLACTIN 36.8 (H) 07/17/2017   Lab Results  Component Value Date   CHOL 203 (H) 01/30/2024   TRIG 246 (H) 01/30/2024   HDL 32 (L) 01/30/2024   CHOLHDL 6.3 (H) 01/30/2024   VLDL 25 07/18/2017   LDLCALC 127 (H) 01/30/2024   LDLCALC 117 (H) 01/24/2023    Physical Findings: AIMS: AIMS: Facial and Oral Movements Muscles of Facial Expression: None Lips and Perioral Area: None Jaw: None Tongue: None,Extremity Movements Upper (arms, wrists, hands, fingers): None Lower (legs, knees, ankles, toes): None, Trunk Movements Neck, shoulders, hips: None, Global Judgements Severity of abnormal movements overall : None Incapacitation due to abnormal movements: None Patient's awareness of abnormal movements: No Awareness, Dental Status Current problems with teeth and/or dentures?: No Does patient usually wear dentures?: No Edentia?: No  Total score: 0  Tremors:No Cogwheeling:  no Rigidity:No     Psychiatric Specialty Exam:  Presentation  General Appearance: Wearing paper scrubs.  Overweight.  Fair grooming.  Observed in the room laying in bed and responding quickly to voice.  Eye Contact: Fair, at times appears to be tracking things that are not present  Speech: Continuing to thought blocked but and shorter durations and less frequency than yesterday  Speech Volume:Decreased   Mood and Affect  Mood: Good Affect:Blunt   Thought Process  Thought Processes:Disorganized (Can answer short responses, sometimes answers do not make sense, sometimes reasoning is not logical)  Descriptions of Associations:Loose  Orientation:Full (Time, Place and Person)  Thought Content: More logical than yesterday but still has some random illogical associations such as finger injury that he sustained yesterday while playing basketball being responsible for his  current admission.  Some unspecific paranoid ideations that people are trying to harm him here.  Does not understand why he is here despite frequent reorienting about reason for admission.  History of Schizophrenia/Schizoaffective disorder: schizophrenia since age 72  Hallucinations: Laughing randomly during interview today after seemingly tracking something with his eyes.  Subjectively denies  Ideas of Reference:None  Suicidal Thoughts:Suicidal Thoughts: No  Homicidal Thoughts:Homicidal Thoughts: No   Sensorium  Memory:Immediate Poor; Recent Poor; Remote Fair (Unable to remember what brought to the hospital also has issues with memory testing.  Recalls 3 of 3 objects immediately but only recalls 1 out of 3 objects unprompted and 5 minutes.  Recalls another object with prompting.)  Judgment: improving, open to LAI when saying his GMA wants him on it. Not constantly asking to leave.  Insight: poor, still not undertstanding why or even that he is in a mental health hospital   Executive Functions   Concentration:Fair  Attention Span:Fair  Recall:Poor  Fund of Knowledge:Poor  Language:Fair   Psychomotor Activity  Psychomotor Activity:Psychomotor Activity: Decreased (No EPS.)   Assets  Assets:Desire for Improvement; Housing; Social Support   Sleep  Sleep:Sleep: Poor (Subjectively per patient)    Physical Exam: Physical Exam Vitals and nursing note reviewed.  HENT:     Head: Normocephalic and atraumatic.  Pulmonary:     Effort: Pulmonary effort is normal.  Musculoskeletal:     Comments: R thumb with normal range of motion. Some bruising noted over the thenal muscle. No significant tenderness to touch of R thumb. Subjective pain with range of motion.   Neurological:     General: No focal deficit present.     Mental Status: He is alert.  Psychiatric:     Comments: No obvious EPS.    Review of Systems  Constitutional:  Negative for fever.  Cardiovascular:  Negative for chest pain and palpitations.  Gastrointestinal:  Negative for constipation, diarrhea, nausea and vomiting.  Neurological:  Negative for dizziness, weakness and headaches.  Psychiatric/Behavioral:         Pt denies extrapyramidal symptoms including dystonia (sudden spastic contractions of muscle groups), parkinsonism (bradykinesia, tremors, rigidity), and akathisia (severe restlessness).    Blood pressure 126/79, pulse 78, temperature 98.2 F (36.8 C), temperature source Oral, resp. rate 18, height 5' 9 (1.753 m), weight 110 kg, SpO2 99%. Body mass index is 35.83 kg/m.   ASSESSMENT & PLAN   ASSESSMENT:   Diagnoses / Active Problems:  Principal Problem:   Schizophrenia (HCC)     GIANCARLO ASKREN is a 28 y.o. male with history of schizophrenia, hospitalizations for psychosis in 2018 and 2019, similar presentation in 2019 specifically, no past suicide attempts who presents with disorganization from psychosis that is likely impairing his functioning and concerns for the safety of himself and  others due to concerns stated in IVC per grandmother.    Tolerating restarted home Risperdal  3 mg twice daily without side effects including EPS.  Patient is agreeable to doing LAI prior to discharge.  Will probably observe for 1 more day and then give risperidone  LAI versus paliperidone LAI.  Some improvements in his psychosis with less thought blocking, better judgment, and improved attention.  Did not get blood draws for RPR and HIV this morning and unclear why not.    PLAN: Safety and Monitoring:             -- Involuntary admission to inpatient psychiatric unit for safety, stabilization and treatment             --  Daily contact with patient to assess and evaluate symptoms and progress in treatment             -- Patient's case to be discussed in multi-disciplinary team meeting             -- Observation Level : q15 minute checks             -- Vital signs:  q12 hours             -- Precautions: suicide, elopement, and assault   2. Psychiatric Treatment:  -- Continue home risperidone  3 mg twice daily for schizophrenia  -- Continue home Cogentin  0.5 mg twice daily for EPS prophylaxis               -- Continue trazodone  50 mg once nightly as needed for insomnia             -- Continue hydroxyzine  25 mg 3 times daily as needed for anxiety   --  The risks/benefits/side-effects/alternatives to this medication were discussed in detail with the patient and time was given for questions. The patient consents to medication trial.              -- Metabolic profile and EKG monitoring obtained while on an atypical antipsychotic. See #4 below for values.              -- Encouraged patient to participate in unit milieu and in scheduled group therapies              -- Short Term Goals: Ability to identify changes in lifestyle to reduce recurrence of condition will improve, Ability to verbalize feelings will improve, Ability to disclose and discuss suicidal ideas, Ability to demonstrate self-control will  improve, Ability to identify and develop effective coping behaviors will improve, Ability to maintain clinical measurements within normal limits will improve, Compliance with prescribed medications will improve, and Ability to identify triggers associated with substance abuse/mental health issues will improve             -- Long Term Goals: Improvement in symptoms so as ready for discharge                3. Medical Issues Being Addressed:              -- Mixed hyperlipidemia: Consider statin in future and nutritionist for diet optimization and encourage exercise               -- Continue PRN's: Tylenol , Maalox, Milk of Magnesia     4. Routine and other pertinent labs reviewed: EKG monitoring: QTc: 396  Metabolism / endocrine: BMI: Body mass index is 35.83 kg/m. Prolactin: Lab Results  Component Value Date   PROLACTIN 51.8 (H) 07/18/2017   PROLACTIN 36.8 (H) 07/17/2017   Lipid Panel: Lab Results  Component Value Date   CHOL 203 (H) 01/30/2024   TRIG 246 (H) 01/30/2024   HDL 32 (L) 01/30/2024   CHOLHDL 6.3 (H) 01/30/2024   VLDL 25 07/18/2017   LDLCALC 127 (H) 01/30/2024   LDLCALC 117 (H) 01/24/2023   HbgA1c: HB A1C (BAYER DCA - WAIVED) (%)  Date Value  08/27/2024 5.6   TSH: TSH (uIU/mL)  Date Value  01/30/2024 1.130    Labs to order: None  5. Discharge Planning:              -- Social work and case management to assist with discharge planning and identification of hospital follow-up needs prior to  discharge             -- Estimated LOS: 10/13 or earlier             -- Discharge Concerns: Need to establish a safety plan; Medication compliance and effectiveness             -- Discharge Goals: Return home with outpatient referrals for mental health follow-up including medication management/psychotherapy    I certify that inpatient services furnished can reasonably be expected to improve the patient's condition.     Justino Cornish, MD PGY-2 Psychiatry  Resident 09/29/2024, 12:54 PM

## 2024-09-29 NOTE — Group Note (Signed)
 Date:  09/29/2024 Time:  5:26 PM  Group Topic/Focus:  Dimensions of Wellness:   The focus of this group is to introduce the topic of wellness and discuss the role each dimension of wellness plays in total health.    Participation Level:  Did Not Attend   Annalee Larch 09/29/2024, 5:26 PM

## 2024-09-29 NOTE — Group Note (Signed)
 Date:  09/29/2024 Time:  9:55 AM  Group Topic/Focus:  Goals Group:   The focus of this group is to help patients establish daily goals to achieve during treatment and discuss how the patient can incorporate goal setting into their daily lives to aide in recovery.    Participation Level:  Did Not Attend  Bobby [name] 09/29/2024, 9:55 AM

## 2024-09-29 NOTE — [facility] Suicide Risk Assessment (Signed)
 [facility] INPATIENT:  Family/Significant Other Suicide Prevention Education  Suicide Prevention Education:  Education Completed; Dickey Gunner Broadnax,maternal grandmother [phone]  (name of family member/significant other) has been identified by the patient as the family member/significant other with whom the patient will be residing, and identified as the person(s) who will aid the patient in the event of a mental health crisis (suicidal ideations/suicide attempt).  With written consent from the patient, the family member/significant other has been provided the following suicide prevention education, prior to the and/or following the discharge of the patient.  Pt gave written consent to speak with grandmother who reported no safety concerns with pt returning home. Mrs. Broadnax reported no firearms in the home. Mrs. Broadnax reported pt has refused to take his medications for four months and has become very paranoid, seeing, hearing things and talking to people that no one else sees. Mrs. Broadnax reported that pt made this comment  you know I am going to have to kill him don't you when asked who the person was, pt responded by saying,  you know who he is pt looked down the hallway. Mrs. Broadnax reported pt's younger cousin lives in the home but feels once pt is back on his medication there will be no issue. Mrs. Broadnax is requesting an injectable.   The suicide prevention education provided includes the following: Suicide risk factors Suicide prevention and interventions National Suicide Hotline telephone number [facility] assessment telephone number [facility] Emergency Assistance 911 [facility] and/or Residential Mobile Crisis Unit telephone number  Request made of family/significant other to: Remove weapons (e.g., guns, rifles, knives), all items previously/currently identified as safety concern.   Remove drugs/medications (over-the-counter, prescriptions, illicit  drugs), all items previously/currently identified as a safety concern.  The family member/significant other verbalizes understanding of the suicide prevention education information provided.  The family member/significant other agrees to remove the items of safety concern listed above.  Selden Noteboom R 09/29/2024, 10:42 AM

## 2024-09-29 NOTE — Progress Notes (Signed)
(  Sleep Hours) - 12 (Any PRNs that were needed, meds refused, or side effects to meds)- n/a  (Any disturbances and when (visitation, over night)- n/a  (Concerns raised by the patient)- n/a  (SI/HI/AVH)- denies

## 2024-09-29 NOTE — [facility] Counselor (Signed)
 Adult Comprehensive Assessment  Patient ID: Bobby [name], male   DOB: 05-29-1996, 28 y.o.   MRN: [id number]  Information Source: Information source: Patient (PSA completed with pt)  Current Stressors:  Patient states their primary concerns and needs for treatment are::  my grandmother thought I cussed her out and I was taken here Patient states their goals for this hospitilization and ongoing recovery are::  I don't know Educational / Learning stressors: none reported Employment / Job issues: none reported Family Relationships: none reported Surveyor, quantity / Lack of resources (include bankruptcy): none reported Housing / Lack of housing: none reported Physical health (include injuries & life threatening diseases): none reported Social relationships: none reported Substance abuse: none reported Bereavement / Loss: none reported  Living/Environment/Situation:  Living Arrangements: Other (Comment) (maternal grandmother- Bobby [name]) Living conditions (as described by patient or guardian):  I live with my grandmother Who else lives in the home?: ' since 2009 How long has patient lived in current situation?: 6 yrs What is atmosphere in current home: Chaotic, Paramedic, Supportive  Family History:  Marital status: Single What is your sexual orientation?: Heterosexual; Interested in Women Has your sexual activity been affected by drugs, alcohol, medication, or emotional stress?: N/A Does patient have children?: No  Childhood History:  By whom was/is the patient raised?: Mother Additional childhood history information: Patient reports father left family when he was about 57 years old. Patient reports mother has always been supportive of patient and they have a good relationship. Patient reports some anger regarding father living them at a young age.  Description of patient's relationship with caregiver when they were a child: Patient reports having a good upbringing by mother.   Patient's description of current relationship with people who raised him/her: mother How were you disciplined when you got in trouble as a child/adolescent?: Unable to assess Does patient have siblings?: Yes Number of Siblings: 1 Description of patient's current relationship with siblings: Patient reports having an 10 year old sister. Patient reports they have a close relationship however his anger can get into the way of their relationship.  Did patient suffer any verbal/emotional/physical/sexual abuse as a child?: No Did patient suffer from severe childhood neglect?: No Has patient ever been sexually abused/assaulted/raped as an adolescent or adult?: No Was the patient ever a victim of a crime or a disaster?: No Witnessed domestic violence?: No Has patient been affected by domestic violence as an adult?: No  Education:  Highest grade of school patient has completed: 12th Currently a student?: No Learning disability?: No  Employment/Work Situation:   Employment Situation: Employed Where is Patient Currently Employed?: pt is unemployed- recieves SSDI How Long has Patient Been Employed?: 3 months Are You Satisfied With Your Job?: No Do You Work More Than One Job?: No Work Stressors: na Patient's Job has Been Impacted by Current Illness: No What is the Longest Time Patient has Held a Job?: 3 months Where was the Patient Employed at that Time?: [employer] 3 months  Has Patient ever Been in the [employer]?: No  Financial Resources:   Financial resources: Insurance claims handler Does patient have a Lawyer or guardian?: No  Alcohol/Substance Abuse:   What has been your use of drugs/alcohol within the last 12 months?:  1 week ago I smoked weed If attempted suicide, did drugs/alcohol play a role in this?: No Alcohol/Substance Abuse Treatment Hx: Denies past history If yes, describe treatment: na Has alcohol/substance abuse ever caused legal problems?: No  Social Support  System:   Patient's Community Support System: Good Describe Community Support System: [facility] Type of faith/religion: None How does patient's faith help to cope with current illness?: None  Leisure/Recreation:   Do You Have Hobbies?: No  Strengths/Needs:   What is the patient's perception of their strengths?:  I don't know Patient states they can use these personal strengths during their treatment to contribute to their recovery:  I don't know Patient states these barriers may affect/interfere with their treatment:  I don't know Patient states these barriers may affect their return to the community:  I don't know Other important information patient would like considered in planning for their treatment:  I don't know  Discharge Plan:   Currently receiving community mental health services: Yes (From Whom) ([facility]) Patient states concerns and preferences for aftercare planning are:  [facility] and [facility] Patient states they will know when they are safe and ready for discharge when:  I don't know Does patient have access to transportation?: Yes Does patient have financial barriers related to discharge medications?: No Patient description of barriers related to discharge medications: none reported Will patient be returning to same living situation after discharge?: Yes  Summary/Recommendations:   Summary and Recommendations (to be completed by the evaluator): Bobby [name] is a 28 year old male involuntarily admitted to [facility] due to AVH and paranoia. Pt reported no stressors however grandmother (whom pt resides) reported stressors as being pt not taking his medications for the past four months and pt walking outside naked. Pt endorses AVH, denies SI/HI. Pt reported last use of marijuana was about two weeks ago. Pt will continue care with [facility] in [place], [place]. [name] Patient will benefit from crisis stabilization, medication evaluation, group therapy and  psychoeducation, in addition to case management for discharge planning. At discharge it is recommended that Patient adhere to the established discharge plan and continue in treatment.  Bobby [name] R. 09/29/2024

## 2024-09-29 NOTE — Group Note (Signed)
 Date:  09/29/2024 Time:  1:40 PM  Group Topic/Focus:  Recovery Goals:   The focus of this group is to identify appropriate goals for recovery and establish a plan to achieve them. [employer]: To understand individualized and supportive care plans that promote mental health recovery, enhance coping skills, and improve overall well-being, while fostering a safe and inclusive environment for all participants.   Participation Level:  Did Not Attend   Lauris JONELLE Morales 09/29/2024, 1:40 PM

## 2024-09-29 NOTE — Group Note (Signed)
 Recreation Therapy Group Note   Group Topic:Animal Assisted Therapy   Group Date: 09/29/2024 Start Time: 9050 End Time: 1030 Facilitators: Inanna Telford-McCall, LRT,CTRS Location: [address] Dayroom   Animal-Assisted Activity (AAA) Program Checklist/Progress Notes Patient Eligibility Criteria Checklist & Daily Group note for Rec Tx Intervention  AAA/T Program Assumption of Risk Form signed by Patient/ or Parent Legal Guardian Yes  Patient washes hands after animal contact Yes  Behavioral Response:    Education: Hand Washing, Appropriate Animal Interaction   Education Outcome: Acknowledges education.    Affect/Mood: N/A   Participation Level: Did not attend    Clinical Observations/Individualized Feedback:      Plan: Continue to engage patient in RT group sessions 2-3x/week.   Annais Crafts-McCall, LRT,CTRS 09/29/2024 12:30 PM

## 2024-09-29 NOTE — Group Note (Signed)
 LCSW Group Therapy Note   Group Date: 09/29/2024 Start Time: 1100 End Time: 1200   Participation:  did not attend  Type of Therapy:  Group Therapy  Topic:  Stress Less:  Nurturing Your Mind and Body Through Calm    Objective:  Learn techniques for managing stress through body relaxation, mindfulness, and self-compassion.  Goals: Use body relaxation techniques, such as Box Breathing and Progressive Muscle Relaxation, to reduce physical tension. Practice mindfulness to break the cycle of overthinking and mental chatter. Embrace self-compassion to handle stress with kindness and resilience.  Summary:  Today's session focused on calming the body with relaxation techniques, breaking the cycle of stress with mindfulness, and using self-compassion to manage challenges more gracefully. These tools help reduce stress and foster a balanced, peaceful mindset.  Therapeutic Modalities used:  Elements of CBT ( cognitive restructuring)  Elements of DBT (box breathing, progressive body relaxation, mindfulness, acceptance)    Jelisa Coleta O Hosie Sharman, LCSWA 09/29/2024  4:54 PM

## 2024-09-29 NOTE — Plan of Care (Signed)
   Problem: Education: Goal: Emotional status will improve Outcome: Progressing Goal: Mental status will improve Outcome: Progressing   Problem: Activity: Goal: Interest or engagement in activities will improve Outcome: Progressing

## 2024-09-29 NOTE — Progress Notes (Signed)
 Tour of Duty:  Prentice JINNY Angle, RN, 09/29/24, Tour of Duty: 0700-1900  SI/HI/AVH: Denies  Self-Reported   Mood: Negative  Anxiety: Denies Depression: Denies, but Observable Irritability: Denies, but Observable  Broset  Violence Prevention Guidelines *See Row Information*: Small Violence Risk interventions implemented   LBM  Last BM Date : 09/28/24   Pain: present, PRN provided (see MAR)  Patient Refusals (including Rx): Yes, including scheduled Rx  >>Shift Summary: Patient observed to be isolative to room. Patient able to make needs known. Patient unwilling to rouse for 1700 Rx. Patient observed to engage appropriately with staff and peers, but no initiation. Patient taking medications as prescribed, but not on schedule. This shift, no PRN medication requested or required. No reported or observed side effects to medication. No reported or observed agitation, aggression, or other acute emotional distress. Patient continues to report severe R thumb pain, LP aware. No additional reported or observed physical abnormalities or concerns.   Last Vitals  Vitals Weight: 110 kg Temp: 98.2 F (36.8 C) Temp Source: Oral Pulse Rate: 78 Resp: 18 BP: 126/79 Patient Position: (not recorded)  Admission Type  Psych Admission Type (Psych Patients Only) Admission Status: Involuntary Date 72 hour document signed : (not recorded) Time 72 hour document signed : (not recorded) Provider Notified (First and Last Name) (see details for LINK to note): (not recorded)   Psychosocial Assessment  Psychosocial Assessment Patient Complaints: None Eye Contact: Fair Facial Expression: Flat Affect: Flat Speech: Soft Interaction: Childlike, Guarded Motor Activity: Slow Appearance/Hygiene: Unremarkable Behavior Characteristics: Resistant to care Mood: Ambivalent   Aggressive Behavior  Targets: (not recorded)   Thought Process  Thought Process Coherency: Within Defined Limits Content:  Within Defined Limits Delusions: None reported or observed Perception: Within Defined Limits Hallucination: None reported or observed Judgment: Limited Confusion: None  Danger to Self/Others  Danger to Self Current suicidal ideation?: Denies Description of Suicide Plan: (not recorded) Self-Injurious Behavior: (not recorded) Agreement Not to Harm Self: (not recorded) Description of Agreement: (not recorded) Danger to Others: None reported or observed

## 2024-09-29 NOTE — Plan of Care (Signed)
   Problem: Education: Goal: Emotional status will improve Outcome: Not Progressing Goal: Mental status will improve Outcome: Not Progressing

## 2024-09-30 ENCOUNTER — Telehealth (HOSPITAL_COMMUNITY): Payer: Self-pay | Admitting: Pharmacy Technician

## 2024-09-30 ENCOUNTER — Other Ambulatory Visit (HOSPITAL_COMMUNITY): Payer: Self-pay

## 2024-09-30 LAB — RPR: RPR Ser Ql: NONREACTIVE

## 2024-09-30 LAB — HIV ANTIBODY (ROUTINE TESTING W REFLEX): HIV Screen 4th Generation wRfx: NONREACTIVE

## 2024-09-30 MED ORDER — RISPERIDONE ER 125 MG/0.35ML ~~LOC~~ SUSY
125.0000 mg | PREFILLED_SYRINGE | SUBCUTANEOUS | Status: DC
  Administered 2024-09-30: 125 mg via SUBCUTANEOUS

## 2024-09-30 MED ORDER — PALIPERIDONE PALMITATE ER 234 MG/1.5ML IM SUSY: 234.0000 mg | PREFILLED_SYRINGE | Freq: Once | INTRAMUSCULAR | Status: DC

## 2024-09-30 MED ORDER — BENZTROPINE MESYLATE 1 MG/ML IJ SOLN: 2.0000 mg | Freq: Two times a day (BID) | INTRAMUSCULAR | Status: DC | PRN

## 2024-09-30 MED ORDER — PALIPERIDONE PALMITATE ER 156 MG/ML IM SUSY: 156.0000 mg | PREFILLED_SYRINGE | Freq: Once | INTRAMUSCULAR | Status: DC

## 2024-09-30 MED ORDER — PALIPERIDONE PALMITATE ER 234 MG/1.5ML IM SUSY
234.0000 mg | PREFILLED_SYRINGE | Freq: Once | INTRAMUSCULAR | Status: DC
Start: 2024-09-30 — End: 2024-09-30

## 2024-09-30 MED ORDER — RISPERIDONE ER 125 MG/0.35ML ~~LOC~~ SUSY
125.0000 mg | PREFILLED_SYRINGE | SUBCUTANEOUS | Status: DC
  Filled 2024-09-30: qty 0.35

## 2024-09-30 MED ORDER — BENZTROPINE MESYLATE 1 MG PO TABS: 2.0000 mg | ORAL_TABLET | Freq: Two times a day (BID) | ORAL | Status: DC | PRN

## 2024-09-30 NOTE — Group Note (Signed)
 Date:  09/30/2024 Time:  5:42 AM  Group Topic/Focus:  Wrap-Up Group:   The focus of this group is to help patients review their daily goal of treatment and discuss progress on daily workbooks.    Additional Comments:  Pt was encouraged, but opted out of attending wrap up group this evening.   Bobby [name] 09/30/2024, 5:42 AM

## 2024-09-30 NOTE — Plan of Care (Signed)
  Problem: Education: Goal: Knowledge of [place] [facility] Education information/materials will improve Outcome: Progressing Goal: Verbalization of understanding the information provided will improve Outcome: Progressing   Problem: Coping: Goal: Ability to verbalize frustrations and anger appropriately will improve Outcome: Progressing   Problem: Health Behavior/Discharge Planning: Goal: Identification of resources available to assist in meeting health care needs will improve Outcome: Progressing   Problem: Safety: Goal: Periods of time without injury will increase Outcome: Progressing

## 2024-09-30 NOTE — Telephone Encounter (Signed)
 Patient Product/process development scientist completed.    The patient is insured through [employer]. Patient has Medicare and is not eligible for a copay card, but may be able to apply for patient assistance or Medicare RX Payment Plan (Patient Must reach out to their plan, if eligible for payment plan), if available.    Ran test claim for Uzedy  125 mg and the current 30 day co-pay is $4.80.   This test claim was processed through [facility]- copay amounts may vary at other pharmacies due to pharmacy/plan contracts, or as the patient moves through the different stages of their insurance plan.     Reyes Sharps, CPHT Pharmacy Technician Patient Advocate Specialist Lead [facility] Patient Advocate Team Direct Number: [phone]  Fax: [phone]

## 2024-09-30 NOTE — Group Note (Signed)
 Date:  09/30/2024 Time:  8:24 PM  Group Topic/Focus:  Wrap-Up Group:   The focus of this group is to help patients review their daily goal of treatment and discuss progress on daily workbooks.    Participation Level:  Did Not Attend  Participation Quality:  N/A  Affect:  N/A  Cognitive:  N/A  Insight: None  Engagement in Group:  N/A  Modes of Intervention:  N/A  Additional Comments:  Patient was encouraged but did not attend  Eward Mace 09/30/2024, 8:24 PM

## 2024-09-30 NOTE — Plan of Care (Signed)
  Problem: Coping: Goal: Ability to verbalize frustrations and anger appropriately will improve Outcome: Progressing   Problem: Safety: Goal: Periods of time without injury will increase Outcome: Progressing   Problem: Education: Goal: Knowledge of [place] [facility] Education information/materials will improve Outcome: Not Progressing   Problem: Activity: Goal: Interest or engagement in activities will improve Outcome: Not Progressing

## 2024-09-30 NOTE — Progress Notes (Signed)
 Bobby [name]   Type of Note: Check-in  Spoke with patient this afternoon at bedside. Pt reports no concerns or needs at this time. Encouraged to voice needs if any arise, pt agreeable.   Signed:  Ethon Wymer, LCSW-A 09/30/2024  3:48 PM

## 2024-09-30 NOTE — Group Note (Signed)
 Date:  09/30/2024 Time:  9:39 AM  Group Topic/Focus:  Emotional Education:   The focus of this group is to discuss what feelings/emotions are, and how they are experienced. Goals Group:   The focus of this group is to help patients establish daily goals to achieve during treatment and discuss how the patient can incorporate goal setting into their daily lives to aide in recovery. Orientation:   The focus of this group is to educate the patient on the purpose and policies of crisis stabilization and provide a format to answer questions about their admission.  The group details unit policies and expectations of patients while admitted.    Participation Level:  Did Not Attend  Logan LITTIE Molly 09/30/2024, 9:39 AM

## 2024-09-30 NOTE — Progress Notes (Signed)
 [facility] MD Progress Note  09/30/2024 4:26 PM Bobby [name]  MRN:  [id number]  Principal Problem: Schizophrenia [facility]) Diagnosis: Principal Problem:   Schizophrenia (HCC)   Reason for Admission:  Bobby [name] is a 28 y.o. male  with a past psychiatric history of schizophrenia, hospitalizations in 2018 and 2019 for psychosis, no past suicide attempts. Patient initially arrived to [facility] on 09/26/2024 for involuntary commitment from grandmother for psychosis, and admitted to [facility] under IVC on 09/27/2024 for acute safety concerns, impaired functioning, intensive therapeutic interventions, and stabilization of acute on chronic psychiatric conditions. (admitted on 09/27/2024, total  LOS: 3 days )   last 24 hours:  Per overnight nurse, Slept 8.5 hours. Prn traz and hydroxyzine . No visitors.  Per daytime nurse, Patient observed to be isolative to room. Patient able to make needs known. Patient unwilling to rouse for 1700 Rx. Patient observed to engage appropriately with staff and peers, but no initiation. Patient taking medications as prescribed, but not on schedule. This shift, no PRN medication requested or required. No reported or observed side effects to medication. No reported or observed agitation, aggression, or other acute emotional distress. Patient continues to report severe R thumb pain, LP aware. No additional reported or observed physical abnormalities or concerns.    Information Obtained Today During Patient Interview:  Reports that he is ambivalent about taking the risperidone  and does not understand if he has a problem or not.  Says he does not understand the diagnosis of schizophrenia.  Reports that the medicine makes him tired and he does not want to take it.  Asked patient why he came to the hospital and he is unable to state still.  Provided patient IVC and he said that he did not remember doing those things.  Discussed doing longer to injectable patient and he was hesitant.  Patient  spoke with his mother.   Spoke to patient again and he was more open to doing long-acting injectable to avoid being the bad person.  Told patient that he cannot go back to grandma's without the injection.  He was open to talking about the injection and was provided the benefits and risk.  After long discussion he was agreeable to receiving uzedy  given it was 1 dose and would get him out of hospital quicker than invega which requires two doses.   Otherwise reports that he has been sleeping okay.  Says that he feels tired and he has been in bed all day.  Reports that he has not had much appetite.  Denies any AVH or paranoia currently. Pt denies extrapyramidal symptoms including dystonia (sudden spastic contractions of muscle groups), parkinsonism (bradykinesia, tremors, rigidity), and akathisia (severe restlessness).    Past Psychiatric Hx: Current Psychiatrist: [facility] per chart review Current Therapist: None Previous Psychiatric Diagnoses: Schizophrenia per chart review Psychiatric Medications: Current Risperidone  3 mg twice daily Cogentin  0.5 mg twice daily Past Per telemetry psychiatrist note chart review has been on Abilify  Maintena, Cogentin , hydroxyzine , risperidone , trazodone , tegretol  and Seroquel   Psychiatric Hospitalization hx: 2018 and 2019, similar presentation in 2019 and got better in 3 days on risperidone  3 twice daily Psychotherapy hx: None ACT team hx: None Neuromodulation history: None History of suicide: None History of homicide or aggression: None   Substance Use Hx: Patient is unable to provide this history but per chart review and grandmother used cannabis 2 weeks ago with sister.  UDS positive for cannabis.   Past Medical History: Unable to provide this information.  No  pertinent medical issues per grandmother.   Family History: family history is not on file.  No pertinent per grandmother.   Social History: Developmental: Developed schizophrenia when he  was 28 years old after using an unknown substance.  Grandmother has been primary caretaker since that time. Current Living Situation: Grandmother in [place]. Education: 12th grade but did not finish high school. Occupation: On disability and not working. Hobbies: None because I am disabled   Legal: denies any upcoming court dates.  Access to firearms: Denied per patient and collateral   Current Medications: Current Facility-Administered Medications  Medication Dose Route Frequency Provider Last Rate Last Admin   acetaminophen  (TYLENOL ) tablet 650 mg  650 mg Oral Q6H PRN Kline, Kyra A, NP   650 mg at 09/29/24 1204   albuterol  (PROVENTIL ) (2.5 MG/3ML) 0.083% nebulizer solution 3 mL  3 mL Inhalation Q6H PRN Kline, Kyra A, NP       alum & mag hydroxide-simeth (MAALOX/MYLANTA) 200-200-20 MG/5ML suspension 30 mL  30 mL Oral Q4H PRN Kline, Kyra A, NP       benztropine  (COGENTIN ) tablet 0.5 mg  0.5 mg Oral BID Kline, Kyra A, NP   0.5 mg at 09/29/24 2116   benztropine  (COGENTIN ) tablet 2 mg  2 mg Oral BID PRN Emmerich Cryer, MD       Or   benztropine  mesylate (COGENTIN ) injection 2 mg  2 mg Intramuscular BID PRN Ramsha Lonigro, MD       cholecalciferol (VITAMIN D3) 25 MCG (1000 UNIT) tablet 1,000 Units  1,000 Units Oral Daily Towana Leita SAILOR, MD   1,000 Units at 09/29/24 1203   haloperidol  (HALDOL ) tablet 5 mg  5 mg Oral TID PRN Kline, Kyra A, NP       And   diphenhydrAMINE (BENADRYL) capsule 50 mg  50 mg Oral TID PRN Kline, Kyra A, NP       haloperidol  lactate (HALDOL ) injection 5 mg  5 mg Intramuscular TID PRN Kline, Kyra A, NP       And   diphenhydrAMINE (BENADRYL) injection 50 mg  50 mg Intramuscular TID PRN Kline, Kyra A, NP       And   LORazepam  (ATIVAN ) injection 2 mg  2 mg Intramuscular TID PRN Kline, Kyra A, NP       haloperidol  lactate (HALDOL ) injection 10 mg  10 mg Intramuscular TID PRN Kline, Kyra A, NP       And   diphenhydrAMINE (BENADRYL) injection 50 mg  50 mg  Intramuscular TID PRN Kline, Kyra A, NP       And   LORazepam  (ATIVAN ) injection 2 mg  2 mg Intramuscular TID PRN Kline, Kyra A, NP       hydrOXYzine  (ATARAX ) tablet 25 mg  25 mg Oral TID PRN Kline, Kyra A, NP   25 mg at 09/29/24 2116   magnesium  hydroxide (MILK OF MAGNESIA) suspension 30 mL  30 mL Oral Daily PRN Kline, Kyra A, NP       ondansetron  (ZOFRAN ) tablet 4 mg  4 mg Oral Q8H PRN Kline, Kyra A, NP       risperiDONE  ER (UZEDY ) SUSY 125 mg  125 mg Subcutaneous Q28 days Johnika Escareno, MD   125 mg at 09/30/24 1458   traZODone  (DESYREL ) tablet 50 mg  50 mg Oral QHS PRN Kline, Kyra A, NP   50 mg at 09/29/24 2116    Lab Results:  Results for orders placed or performed during the hospital encounter of 09/27/24 (  from the past 48 hours)  HIV Antibody (routine testing w rflx)     Status: None   Collection Time: 09/29/24  6:27 PM  Result Value Ref Range   HIV Screen 4th Generation wRfx Non Reactive Non Reactive    Comment: Performed at [facility], [address], [place], [place] [address]  RPR     Status: None   Collection Time: 09/29/24  6:27 PM  Result Value Ref Range   RPR Ser Ql NON REACTIVE NON REACTIVE    Comment: Performed at [facility], [address] [place], [place] [address]    Blood Alcohol level:  Lab Results  Component Value Date   [facility] <15 09/26/2024   ETH <10 12/01/2018    Metabolic Labs: Lab Results  Component Value Date   HGBA1C 5.6 08/27/2024   MPG 111 07/18/2017   MPG 103 07/17/2017   Lab Results  Component Value Date   PROLACTIN 51.8 (H) 07/18/2017   PROLACTIN 36.8 (H) 07/17/2017   Lab Results  Component Value Date   CHOL 203 (H) 01/30/2024   TRIG 246 (H) 01/30/2024   HDL 32 (L) 01/30/2024   CHOLHDL 6.3 (H) 01/30/2024   VLDL 25 07/18/2017   LDLCALC 127 (H) 01/30/2024   LDLCALC 117 (H) 01/24/2023    Physical Findings: AIMS: AIMS: Facial and Oral Movements Muscles of Facial Expression: None Lips and Perioral Area: None Jaw:  None Tongue: None,Extremity Movements Upper (arms, wrists, hands, fingers): None Lower (legs, knees, ankles, toes): None, Trunk Movements Neck, shoulders, hips: None, Global Judgements Severity of abnormal movements overall : None Incapacitation due to abnormal movements: None Patient's awareness of abnormal movements: No Awareness, Dental Status Current problems with teeth and/or dentures?: No Does patient usually wear dentures?: No Edentia?: No  Total score: 0  Tremors:No Cogwheeling: no Rigidity:No     Psychiatric Specialty Exam:  Presentation  General Appearance: Wearing paper scrubs.  Overweight.  Fair grooming.  Observed in the room laying in bed and responding quickly to voice.  Eye Contact: Improving and fair  Speech: Minimal thought blocking compared to previous days  Speech Volume:Decreased   Mood and Affect  Mood: Good Affect:Blunt   Thought Process  Thought Processes: Still some loose associations but mostly linear and improved.  Still has some issues with executive functioning. Descriptions of Associations: Mostly linear Orientation:Full (Time, Place and Person)  Thought Content: Able to have logical conversation around his illness although still has some loose associations and illogical statements.    History of Schizophrenia/Schizoaffective disorder: schizophrenia since age 51  Hallucinations: Denies, no obvious RTIS  Ideas of Reference:None  Suicidal Thoughts: Denies  Homicidal Thoughts: Denies   Sensorium  Memory: Did not assess today but is grossly intact Judgment: Fair Insight: poor but improving  Executive Functions  Concentration:Fair  Attention Span:Fair  Recall:Poor  Fund of Knowledge:Poor  Language:Fair   Psychomotor Activity  Psychomotor Activity: Decreased   Assets  Assets:Desire for Improvement; Housing; Social Support   Sleep  Sleep: Slept good but is spending a lot of time in bed during daytime  hours    Physical Exam: Physical Exam Vitals and nursing note reviewed.  HENT:     Head: Normocephalic and atraumatic.  Pulmonary:     Effort: Pulmonary effort is normal.  Musculoskeletal:     Comments: Normal range of motion function of right thumb and no issues with using it during interview.  Neurological:     General: No focal deficit present.  Mental Status: He is alert.  Psychiatric:     Comments: No obvious EPS.    Review of Systems  Constitutional:  Negative for fever.  Cardiovascular:  Negative for chest pain and palpitations.  Gastrointestinal:  Negative for constipation, diarrhea, nausea and vomiting.  Neurological:  Negative for dizziness, weakness and headaches.  Psychiatric/Behavioral:         Pt denies extrapyramidal symptoms including dystonia (sudden spastic contractions of muscle groups), parkinsonism (bradykinesia, tremors, rigidity), and akathisia (severe restlessness).    Blood pressure 126/79, pulse 78, temperature 98.2 F (36.8 C), temperature source Oral, resp. rate 18, height 5' 9 (1.753 m), weight 110 kg, SpO2 99%. Body mass index is 35.83 kg/m.   ASSESSMENT & PLAN   ASSESSMENT:   Diagnoses / Active Problems:  Principal Problem:   Schizophrenia (HCC)     Bobby [name] is a 28 y.o. male with history of schizophrenia, hospitalizations for psychosis in 2018 and 2019, similar presentation in 2019 specifically, no past suicide attempts who presents with disorganization from psychosis that is likely impairing his functioning and concerns for the safety of himself and others due to concerns stated in IVC per grandmother.    Patient was hesitant to take his risperidone  but after discussion was agreeable to starting a long-acting injectable in order to return home with his grandmother.  He was tolerating risperidone  3 twice daily for almost 3 days prior to receiving injection and has been on risperidone  in the past and tolerated without side effects.   Will give risperidone  LAI given that it only requires 1 dose and will allow patient to achieve his goal of getting out of hospital quicker.  Given patient is on 6 mg daily will do the 125 mg dose which is equivalent to 5 mg and the maximum 1 month dose.  This is reasonable given patient has had substantial clinical improvement.  Will still continue Cogentin  orally.  Will also include higher dose of as needed Cogentin  in case patient has dystonic reaction or other severe EPS.   PLAN: Safety and Monitoring:             -- Involuntary admission to inpatient psychiatric unit for safety, stabilization and treatment             -- Daily contact with patient to assess and evaluate symptoms and progress in treatment             -- Patient's case to be discussed in multi-disciplinary team meeting             -- Observation Level : q15 minute checks             -- Vital signs:  q12 hours             -- Precautions: suicide, elopement, and assault   2. Psychiatric Treatment:  -- Start Uzedy  125 mg once every 28 days (received 10/8, next dose 11/5) -- Discontinue oral risperidone  as no oral bridge is required -- Continue home Cogentin  0.5 mg twice daily for EPS prophylaxis -- Start cogentin  2 mg PO or IM PRN for severe EPS / acute dystonia (given refusing his scheduled cogentin  and got LAI today)               -- Continue trazodone  50 mg once nightly as needed for insomnia             -- Continue hydroxyzine  25 mg 3 times daily as needed for anxiety   --  The risks/benefits/side-effects/alternatives to this medication were discussed in detail with the patient and time was given for questions. The patient consents to medication trial.              -- Metabolic profile and EKG monitoring obtained while on an atypical antipsychotic. See #4 below for values.              -- Encouraged patient to participate in unit milieu and in scheduled group therapies              -- Short Term Goals: Ability to identify  changes in lifestyle to reduce recurrence of condition will improve, Ability to verbalize feelings will improve, Ability to disclose and discuss suicidal ideas, Ability to demonstrate self-control will improve, Ability to identify and develop effective coping behaviors will improve, Ability to maintain clinical measurements within normal limits will improve, Compliance with prescribed medications will improve, and Ability to identify triggers associated with substance abuse/mental health issues will improve             -- Long Term Goals: Improvement in symptoms so as ready for discharge                3. Medical Issues Being Addressed:              -- Mixed hyperlipidemia: Consider statin in future and nutritionist for diet optimization and encourage exercise               -- Continue PRN's: Tylenol , Maalox, Milk of Magnesia     4. Routine and other pertinent labs reviewed: EKG monitoring: QTc: 396  Metabolism / endocrine: BMI: Body mass index is 35.83 kg/m. Prolactin: Lab Results  Component Value Date   PROLACTIN 51.8 (H) 07/18/2017   PROLACTIN 36.8 (H) 07/17/2017   Lipid Panel: Lab Results  Component Value Date   CHOL 203 (H) 01/30/2024   TRIG 246 (H) 01/30/2024   HDL 32 (L) 01/30/2024   CHOLHDL 6.3 (H) 01/30/2024   VLDL 25 07/18/2017   LDLCALC 127 (H) 01/30/2024   LDLCALC 117 (H) 01/24/2023   HbgA1c: HB A1C (BAYER DCA - WAIVED) (%)  Date Value  08/27/2024 5.6   TSH: TSH (uIU/mL)  Date Value  01/30/2024 1.130    Labs to order: None  5. Discharge Planning:              -- Social work and case management to assist with discharge planning and identification of hospital follow-up needs prior to discharge             -- Estimated LOS: 10/10-10-11             -- Discharge Concerns: Need to establish a safety plan; Medication compliance and effectiveness             -- Discharge Goals: Return home with outpatient referrals for mental health follow-up including medication  management/psychotherapy    I certify that inpatient services furnished can reasonably be expected to improve the patient's condition.     Justino Cornish, MD PGY-2 Psychiatry Resident 09/30/2024, 4:26 PM

## 2024-09-30 NOTE — Progress Notes (Signed)
 Shift Note  (Sleep Hours) - 8.5  (Any PRNs that were needed, meds refused, or side effects to meds)- PRN Trazodone  and Hydroxyzine    (Any disturbances and when (visitation, over night)-None  (Concerns raised by the patient)- None  (SI/HI/AVH)- Denies

## 2024-09-30 NOTE — Group Note (Signed)
 Date:  09/30/2024 Time:  5:11 PM  Group Topic/Focus: Emotional Regulation - Cooking Up Calm Emotional Education:   The focus of this group is to discuss what feelings/emotions are, and how they are experienced. Managing Feelings:   The focus of this group is to identify what feelings patients have difficulty handling and develop a plan to handle them in a healthier way upon discharge. Group Type: Psychoeducational / Skills-Based Group Objective:  Support participants in identifying, understanding, and managing emotions using a structured, relatable approach. Group Summary: Icebreaker: Mood Menu Discussed emotional regulation as a recipe: Name it, Tame it, Frame it Explored emotional triggers and body cues Played Emotion Charades Completed Emotional Regulation Recipe worksheet Shared coping strategies and prep steps Closed with group reflection Skills Practiced: Emotional awareness Coping strategy selection Peer engagement    Additional Comments:  Patient did not attend   Rebekkah Powless N Shatori Bertucci 09/30/2024, 5:11 PM

## 2024-09-30 NOTE — Progress Notes (Signed)
   09/30/24 0950  Psych Admission Type (Psych Patients Only)  Admission Status Involuntary  Psychosocial Assessment  Patient Complaints None  Eye Contact Fair  Facial Expression Flat  Affect Flat  Speech Soft  Interaction Childlike  Motor Activity Slow  Appearance/Hygiene Unremarkable  Behavior Characteristics Resistant to care  Mood Pleasant  Thought Process  Coherency WDL  Content WDL  Delusions None reported or observed  Perception WDL  Hallucination None reported or observed  Judgment Limited  Confusion None  Danger to Self  Current suicidal ideation? Denies  Agreement Not to Harm Self Yes  Description of Agreement verbal  Danger to Others  Danger to Others None reported or observed

## 2024-09-30 NOTE — Progress Notes (Signed)

## 2024-09-30 NOTE — Group Note (Signed)
 Recreation Therapy Group Note   Group Topic:Leisure Education  Group Date: 09/30/2024 Start Time: 0931 End Time: 1015 Facilitators: Geniva Lohnes-McCall, LRT,CTRS Location: [address] Dayroom   Group Topic: Leisure Education  Goal Area(s) Addresses:  Patient will identify positive leisure activities for use post discharge. Patient will identify at least one positive benefit of the program they create. Patient will work effectively work with peer by sharing ideas and contributing to Music therapist.  Behavioral Response:    Intervention: Innovation, Group Presentation   Activity: In pairs or individually, patients were asked to create a program with their teammate. Team's were tasked with designing a program, including a Name, Demographic geared towards, Hours of operation, Location and Benefits. Patients would then present ideas to group.   Education:  Leisure Scientist, physiological, Special educational needs teacher, Teamwork, Discharge Planning  Education Outcome: Acknowledges education/In group clarification offered/Needs additional education.    Affect/Mood: N/A   Participation Level: Did not attend    Clinical Observations/Individualized Feedback:      Plan: Continue to engage patient in RT group sessions 2-3x/week.   Phillp Dolores-McCall, LRT,CTRS 09/30/2024 12:58 PM

## 2024-10-01 NOTE — Progress Notes (Signed)
 [facility] MD Progress Note  10/01/2024 12:56 PM FRANKIE SCIPIO  MRN:  [id number] Subjective:   Bobby [name] is a 28 yr old male who presented on 10/5 to [facility] under IVC due to bizarre behavior after smoking THC 2 weeks prior, he was admitted to [facility] on 10/5.  PPHx is significant for Schizophrenia and Prior Psychiatric Hospitalizations (2018 and 2019), and no history of Suicide Attempts.    Case was discussed in the multidisciplinary team. MAR was reviewed and patient was not compliant with medications yesterday but did receive his Uzedy  LAI.   Psychiatric Team made the following recommendations yesterday: -Uzedy  125 mg LAI given -Stop oral Risperdal     On interview today patient reports he slept good last night.  He reports his appetite is doing good.  He reports no SI, HI, or AVH.  He reports no Paranoia or Ideas of Reference.  He reports no issues with his medications.  He reports no issues with receiving the LAI yesterday and is feeling fine.  He reports no other concerns at present.  Principal Problem: Schizophrenia (HCC) Diagnosis: Principal Problem:   Schizophrenia (HCC)  Total Time spent with patient:  I personally spent 35 minutes on the unit in direct patient care. The direct patient care time included face-to-face time with the patient, reviewing the patient's chart, communicating with other professionals, and coordinating care.    Past Psychiatric History:  Schizophrenia and Prior Psychiatric Hospitalizations (2018 and 2019), and no history of Suicide Attempts.   Past Medical History:  Past Medical History:  Diagnosis Date   Asthma    prn inhaler   Rupture of radial collateral ligament of right thumb 11/2015    Past Surgical History:  Procedure Laterality Date   LIGAMENT REPAIR Right 12/15/2015   Procedure: RIGHT THUMB RADIAL COLLATERAL LIGAMENT REPAIR;  Surgeon: Franky Curia, MD;  Location: [facility];  Service: Orthopedics;  Laterality: Right;   Family  History: History reviewed. No pertinent family history. Family Psychiatric  History:  Reported per Grandmother- No Pertinent   Social History:  Social History   Substance and Sexual Activity  Alcohol Use No     Social History   Substance and Sexual Activity  Drug Use Yes   Types: Marijuana    Social History   Socioeconomic History   Marital status: Single    Spouse name: Not on file   Number of children: Not on file   Years of education: Not on file   Highest education level: Not on file  Occupational History   Not on file  Tobacco Use   Smoking status: Never   Smokeless tobacco: Never  Vaping Use   Vaping status: Never Used  Substance and Sexual Activity   Alcohol use: No   Drug use: Yes    Types: Marijuana   Sexual activity: Not Currently  Other Topics Concern   Not on file  Social History Narrative   ** Merged History Encounter **       Social Drivers of Health   Financial Resource Strain: Not on file  Food Insecurity: No Food Insecurity (09/27/2024)   Hunger Vital Sign    Worried About Running Out of Food in the Last Year: Never true    Ran Out of Food in the Last Year: Never true  Transportation Needs: No Transportation Needs (09/27/2024)   PRAPARE - Administrator, Civil Service (Medical): No    Lack of Transportation (Non-Medical): No  Physical Activity: Not  on file  Stress: Not on file  Social Connections: Not on file   Additional Social History:                         Sleep: Good Estimated Sleeping Duration (Last 24 Hours): 8.00-9.25 hours  Appetite:  Good  Current Medications: Current Facility-Administered Medications  Medication Dose Route Frequency Provider Last Rate Last Admin   acetaminophen  (TYLENOL ) tablet 650 mg  650 mg Oral Q6H PRN Kline, Kyra A, NP   650 mg at 09/29/24 1204   albuterol  (PROVENTIL ) (2.5 MG/3ML) 0.083% nebulizer solution 3 mL  3 mL Inhalation Q6H PRN Kline, Kyra A, NP       alum & mag  hydroxide-simeth (MAALOX/MYLANTA) 200-200-20 MG/5ML suspension 30 mL  30 mL Oral Q4H PRN Kline, Kyra A, NP       benztropine  (COGENTIN ) tablet 0.5 mg  0.5 mg Oral BID Kline, Kyra A, NP   0.5 mg at 09/30/24 1810   benztropine  (COGENTIN ) tablet 2 mg  2 mg Oral BID PRN McCarty, Artie, MD       Or   benztropine  mesylate (COGENTIN ) injection 2 mg  2 mg Intramuscular BID PRN McCarty, Artie, MD       cholecalciferol (VITAMIN D3) 25 MCG (1000 UNIT) tablet 1,000 Units  1,000 Units Oral Daily Towana Leita SAILOR, MD   1,000 Units at 09/29/24 1203   haloperidol  (HALDOL ) tablet 5 mg  5 mg Oral TID PRN Kline, Kyra A, NP       And   diphenhydrAMINE (BENADRYL) capsule 50 mg  50 mg Oral TID PRN Kline, Kyra A, NP       haloperidol  lactate (HALDOL ) injection 5 mg  5 mg Intramuscular TID PRN Kline, Kyra A, NP       And   diphenhydrAMINE (BENADRYL) injection 50 mg  50 mg Intramuscular TID PRN Kline, Kyra A, NP       And   LORazepam  (ATIVAN ) injection 2 mg  2 mg Intramuscular TID PRN Kline, Kyra A, NP       haloperidol  lactate (HALDOL ) injection 10 mg  10 mg Intramuscular TID PRN Kline, Kyra A, NP       And   diphenhydrAMINE (BENADRYL) injection 50 mg  50 mg Intramuscular TID PRN Kline, Kyra A, NP       And   LORazepam  (ATIVAN ) injection 2 mg  2 mg Intramuscular TID PRN Kline, Kyra A, NP       hydrOXYzine  (ATARAX ) tablet 25 mg  25 mg Oral TID PRN Kline, Kyra A, NP   25 mg at 09/29/24 2116   magnesium  hydroxide (MILK OF MAGNESIA) suspension 30 mL  30 mL Oral Daily PRN Kline, Kyra A, NP       ondansetron  (ZOFRAN ) tablet 4 mg  4 mg Oral Q8H PRN Kline, Kyra A, NP       risperiDONE  ER (UZEDY ) SUSY 125 mg  125 mg Subcutaneous Q28 days McCarty, Artie, MD   125 mg at 09/30/24 1458   traZODone  (DESYREL ) tablet 50 mg  50 mg Oral QHS PRN Kline, Kyra A, NP   50 mg at 09/29/24 2116    Lab Results:  Results for orders placed or performed during the hospital encounter of 09/27/24 (from the past 48 hours)  HIV Antibody (routine  testing w rflx)     Status: None   Collection Time: 09/29/24  6:27 PM  Result Value Ref Range   HIV Screen 4th  Generation wRfx Non Reactive Non Reactive    Comment: Performed at [facility], [address], [place], [place] [address]  RPR     Status: None   Collection Time: 09/29/24  6:27 PM  Result Value Ref Range   RPR Ser Ql NON REACTIVE NON REACTIVE    Comment: Performed at [facility], [address], [place], [place] [address]    Blood Alcohol level:  Lab Results  Component Value Date   [facility] <15 09/26/2024   ETH <10 12/01/2018    Metabolic Disorder Labs: Lab Results  Component Value Date   HGBA1C 5.6 08/27/2024   MPG 111 07/18/2017   MPG 103 07/17/2017   Lab Results  Component Value Date   PROLACTIN 51.8 (H) 07/18/2017   PROLACTIN 36.8 (H) 07/17/2017   Lab Results  Component Value Date   CHOL 203 (H) 01/30/2024   TRIG 246 (H) 01/30/2024   HDL 32 (L) 01/30/2024   CHOLHDL 6.3 (H) 01/30/2024   VLDL 25 07/18/2017   LDLCALC 127 (H) 01/30/2024   LDLCALC 117 (H) 01/24/2023    Physical Findings: AIMS: Facial and Oral Movements Muscles of Facial Expression: None Lips and Perioral Area: None Jaw: None Tongue: None, Extremity Movements Upper (arms, wrists, hands, fingers): None Lower (legs, knees, ankles, toes): None, Trunk Movements Neck, shoulders, hips: None, Global Judgements Severity of abnormal movements overall : None Incapacitation due to abnormal movements: None Patient's awareness of abnormal movements: No Awareness, Dental Status Current problems with teeth and/or dentures?: No Does patient usually wear dentures?: No Edentia?: No, Other Do movements disappear in sleep?: No, AIMS Total Score AIMS Total Score: 0 CIWA:    COWS:     Musculoskeletal: Strength & Muscle Tone: within normal limits Gait & Station: normal Patient leans: N/A  Psychiatric Specialty Exam:  Presentation  General Appearance:  Casual  Eye  Contact: Fair  Speech: Clear and Coherent; Normal Rate  Speech Volume: Normal  Handedness:No data recorded  Mood and Affect  Mood: -- (ok)  Affect: Congruent   Thought Process  Thought Processes: Linear  Descriptions of Associations:Intact  Orientation:Full (Time, Place and Person)  Thought Content:WDL  History of Schizophrenia/Schizoaffective disorder:No data recorded Duration of Psychotic Symptoms:No data recorded Hallucinations:Hallucinations: None  Ideas of Reference:None  Suicidal Thoughts:Suicidal Thoughts: No  Homicidal Thoughts:Homicidal Thoughts: No   Sensorium  Memory: Immediate Fair  Judgment: Intact  Insight: Present   Executive Functions  Concentration: Fair  Attention Span: Fair  Recall: [employer] of Knowledge: Fair  Language: Fair   Psychomotor Activity  Psychomotor Activity: Psychomotor Activity: Normal   Assets  Assets: Resilience; Housing; Social Support; Desire for Improvement   Sleep  Sleep: Sleep: Good    Physical Exam: Physical Exam Vitals and nursing note reviewed.  Constitutional:      Appearance: Normal appearance.  Neurological:     Mental Status: He is alert.    Review of Systems  Respiratory:  Negative for cough and shortness of breath.   Cardiovascular:  Negative for chest pain.  Gastrointestinal:  Negative for abdominal pain, constipation, diarrhea, nausea and vomiting.  Neurological:  Negative for dizziness, weakness and headaches.  Psychiatric/Behavioral:  Negative for depression, hallucinations and suicidal ideas. The patient is not nervous/anxious.    Blood pressure 136/82, pulse 73, temperature 98.6 F (37 C), resp. rate 16, height 5' 9 (1.753 m), weight 110 kg, SpO2 100%. Body mass index is 35.83 kg/m.   Treatment Plan Summary: Daily contact with patient  to assess and evaluate symptoms and progress in treatment and Medication management  ADRIENE PADULA is a 28 yr old  male who presented on 10/5 to [facility] under IVC due to bizarre behavior after smoking THC 2 weeks prior, he was admitted to [facility] on 10/5.  PPHx is significant for Schizophrenia and Prior Psychiatric Hospitalizations (2018 and 2019), and no history of Suicide Attempts.    Lash tolerated receiving the Uzedy  yesterday without issue.  If he continues to do well we will attempt to discharge tomorrow back to his grandmother.  We will not make any changes to his medications at this time. We will continue to monitor.    Safety and Monitoring:             -- Involuntary admission to inpatient psychiatric unit for safety, stabilization and treatment             -- Daily contact with patient to assess and evaluate symptoms and progress in treatment             -- Patient's case to be discussed in multi-disciplinary team meeting             -- Observation Level : q15 minute checks             -- Vital signs:  q12 hours             -- Precautions: suicide, elopement, and assault   2. Psychiatric Treatment:  -- Received Uzedy  125 mg on 10/8, next dose due 11/5 -- Continue home Cogentin  0.5 mg twice daily for EPS prophylaxis -- Continue cogentin  2 mg PO or IM PRN for severe EPS / acute dystonia (given refusing his scheduled cogentin  and got LAI today)               -- Continue trazodone  50 mg once nightly as needed for insomnia             -- Continue hydroxyzine  25 mg 3 times daily as needed for anxiety   --  The risks/benefits/side-effects/alternatives to this medication were discussed in detail with the patient and time was given for questions. The patient consents to medication trial.              -- Metabolic profile and EKG monitoring obtained while on an atypical antipsychotic. See #4 below for values.              -- Encouraged patient to participate in unit milieu and in scheduled group therapies              -- Short Term Goals: Ability to identify changes in lifestyle to reduce recurrence of  condition will improve, Ability to verbalize feelings will improve, Ability to disclose and discuss suicidal ideas, Ability to demonstrate self-control will improve, Ability to identify and develop effective coping behaviors will improve, Ability to maintain clinical measurements within normal limits will improve, Compliance with prescribed medications will improve, and Ability to identify triggers associated with substance abuse/mental health issues will improve             -- Long Term Goals: Improvement in symptoms so as ready for discharge                3. Medical Issues Being Addressed:              -- Mixed hyperlipidemia: Consider statin in future and nutritionist for diet optimization and encourage exercise               --  Continue PRN's: Tylenol , Maalox, Milk of Magnesia     4. Routine and other pertinent labs reviewed: EKG monitoring: QTc: 396   Metabolism / endocrine: BMI: Body mass index is 35.83 kg/m. Prolactin: Recent Labs       Lab Results  Component Value Date    PROLACTIN 51.8 (H) 07/18/2017    PROLACTIN 36.8 (H) 07/17/2017      Lipid Panel: Recent Labs       Lab Results  Component Value Date    CHOL 203 (H) 01/30/2024    TRIG 246 (H) 01/30/2024    HDL 32 (L) 01/30/2024    CHOLHDL 6.3 (H) 01/30/2024    VLDL 25 07/18/2017    LDLCALC 127 (H) 01/30/2024    LDLCALC 117 (H) 01/24/2023      HbgA1c: Last Labs     HB A1C (BAYER DCA - WAIVED) (%)  Date Value  08/27/2024 5.6      TSH: Last Labs     TSH (uIU/mL)  Date Value  01/30/2024 1.130        Labs to order: None   5. Discharge Planning:              -- Social work and case management to assist with discharge planning and identification of hospital follow-up needs prior to discharge             -- Estimated LOS: 10/10-10-11             -- Discharge Concerns: Need to establish a safety plan; Medication compliance and effectiveness             -- Discharge Goals: Return home with outpatient  referrals for mental health follow-up including medication management/psychotherapy   Marsa GORMAN Rosser, DO 10/01/2024, 12:56 PM

## 2024-10-01 NOTE — Group Note (Signed)
 LCSW Group Therapy Note   Group Date: 10/01/2024 Start Time: 1100 End Time: 1200   Participation:  did not attend  Type of Therapy:  Group Therapy  Topic:  Understanding Your Path to Change  Objective:  The goal is to help individuals understand the stages of change, identify where they currently are in the process, and provide actionable next steps to continue moving forward in their journey of change.  Goals: - Learn about the six stages of change:  Precontemplation, Contemplation, Preparation, Action, Maintenance, and Relapse - Reflect on Current Change Efforts:  Recognize which stage participants are in regarding a personal change. - Plan Next Steps for Moving Forward:  Create an action plan based on their current stage of change.  Summary:  In this session, we explored the Stages of Change as a framework to understand the process of change.  We discussed how each stage helps individuals recognize where they are in their personal journey and used the Stages of Change Worksheet for self-reflection. Participants answered questions to better understand their current stage, challenges, and progress. We also emphasized the importance of moving forward, even if setbacks (Relapse) occur, and created actionable steps to help participants continue progressing. By the end of the session, participants gained a clearer understanding of their path to change and left with a clear plan for next steps.  Therapeutic Modalities:  Elements of CBT (cognitive restructuring, problem solving)  Element of DBT (mindfulness, distress tolerance)   Bobby [name] Bobby [name], LCSWA 10/01/2024  12:25 PM

## 2024-10-01 NOTE — Progress Notes (Signed)
(  Sleep Hours) - 8.5 (Any PRNs that were needed, meds refused, or side effects to meds)- none (Any disturbances and when (visitation, over night)-n/a (Concerns raised by the patient)- none (SI/HI/AVH)- denies

## 2024-10-01 NOTE — Progress Notes (Signed)
   09/30/24 2100  Psych Admission Type (Psych Patients Only)  Admission Status Involuntary  Psychosocial Assessment  Patient Complaints None  Eye Contact Fair  Facial Expression Animated  Affect Appropriate to circumstance  Speech Logical/coherent  Interaction Assertive  Motor Activity Slow  Appearance/Hygiene Unremarkable  Behavior Characteristics Cooperative;Appropriate to situation  Mood Pleasant  Thought Process  Coherency WDL  Content WDL  Delusions None reported or observed  Perception WDL  Hallucination None reported or observed  Judgment Limited  Confusion None  Danger to Self  Current suicidal ideation? Denies  Agreement Not to Harm Self Yes  Description of Agreement verbal  Danger to Others  Danger to Others None reported or observed

## 2024-10-01 NOTE — Plan of Care (Signed)
 Nurse discussed anxiety, depression and coping skills with patient.

## 2024-10-01 NOTE — Group Note (Signed)
 Date:  10/01/2024 Time:  8:35 PM  Group Topic/Focus:  Wrap-Up Group:   The focus of this group is to help patients review their daily goal of treatment and discuss progress on daily workbooks.    Participation Level:  Did Not Attend  Participation Quality:  N/A  Affect:  N/A  Cognitive:  N/A  Insight: None  Engagement in Group:  N/A  Modes of Intervention:  N/A  Additional Comments:  Patient was encouraged but did not attend  Eward Mace 10/01/2024, 8:35 PM

## 2024-10-01 NOTE — Group Note (Signed)
 Date:  10/01/2024 Time:  9:25 AM  Group Topic/Focus:  Goals Group:   The focus of this group is to help patients establish daily goals to achieve during treatment and discuss how the patient can incorporate goal setting into their daily lives to aide in recovery.    Participation Level:  Did Not Attend  Participation Quality:  Did Not Attend  Affect:  Did Not Attend  Cognitive:  Did Not Attend  Insight: None  Engagement in Group:  Did Not Attend  Modes of Intervention:  Did Not Attend  Additional Comments:  Did Not Attend  Chandrika Sandles A Keylie Beavers 10/01/2024, 9:25 AM

## 2024-10-01 NOTE — Group Note (Signed)
 Recreation Therapy Group Note   Group Topic:Other  Group Date: 10/01/2024 Start Time: 1304 End Time: 1350 Facilitators: Serita Degroote-McCall, LRT,CTRS Location: [address] Dayroom   Activity Description/Intervention: Therapeutic Drumming. Patients with peers and staff were given the opportunity to engage in a leader facilitated [facility] Group Empowerment Drumming Circle with staff from the [employer], in partnership with [employer]. Teaching laboratory technician and trained [employer], Norleen Mon leading with LRT observing and documenting intervention and pt response. This evidenced-based practice targets 7 areas of health and wellbeing in the human experience including: stress-reduction, exercise, self-expression, camaraderie/support, nurturing, spirituality, and music-making (leisure).   Goal Area(s) Addresses:  Patient will engage in pro-social way in music group.  Patient will follow directions of drum leader on the first prompt. Patient will demonstrate no behavioral issues during group.  Patient will identify if a reduction in stress level occurs as a result of participation in therapeutic drum circle.    Education: Leisure exposure, Pharmacologist, Musical expression, Discharge Planning   Affect/Mood: N/A   Participation Level: Did not attend    Clinical Observations/Individualized Feedback:      Plan: Continue to engage patient in RT group sessions 2-3x/week.   Herschel Fleagle-McCall, LRT,CTRS 10/01/2024 3:59 PM

## 2024-10-01 NOTE — Progress Notes (Addendum)
 D:  Patient's self inventory sheet, patient sleeps good, no sleep medication.  Poor appetite, low energy level, poor concentration.  Rated depression, hopeless and anxiety 10.  Denied withdrawals.  Goal is discharge home.  Mentioned his R broken thumb that occurred before [facility] admission.  Refused pain medicine. A:  Patient refused cogentin  this morning.  Stated he does not like the way he feels after taking the medicine.  Emotional support and encouragement given patient. R:  Denied SI and HI, contracts for safety.  Denied A/V hallucinations.  Safety maintained with 15 minute checks.

## 2024-10-02 MED ORDER — UZEDY 125 MG/0.35ML ~~LOC~~ SUSY: 125.0000 mg | PREFILLED_SYRINGE | SUBCUTANEOUS | Status: DC

## 2024-10-02 MED ORDER — BENZTROPINE MESYLATE 0.5 MG PO TABS: 0.5000 mg | ORAL_TABLET | Freq: Two times a day (BID) | ORAL | 0 refills | Status: AC

## 2024-10-02 NOTE — Progress Notes (Signed)
  [facility] Adult Case Management Discharge Plan :  Will you be returning to the same living situation after discharge:  Yes,  pt returning home at discharge At discharge, do you have transportation home?: Yes,  will be picked up by grandmother at 10AM Do you have the ability to pay for your medications: Yes,  pt has active health insurance coverage  Release of information consent forms completed and in the chart;  Patient's signature needed at discharge.  Patient to Follow up at:  Follow-up Information     Services, [facility]. Go on 10/05/2024.   Why: You have a hospital follow up appointment for therapy and medication management services on 10/05/24 at 10:00 am.  The appointment will be held in person. Contact information: [address] [place] [address] [phone]                 Next level of care provider has access to [facility] Link:no  Safety Planning and Suicide Prevention discussed: Yes,   Dickey Cy Robbert karna grandmother [phone]      Has patient been referred to the Quitline?: Patient does not use tobacco/nicotine products  Patient has been referred for addiction treatment: No known substance use disorder.  Jenkins LULLA Primer, LCSWA 10/02/2024, 8:44 AM

## 2024-10-02 NOTE — Care Management Important Message (Signed)
 Medicare IM printed and given to social work to give to the patient. ?

## 2024-10-02 NOTE — Discharge Summary (Addendum)
 Physician Discharge Summary Note  Patient:  Bobby [name] is an 28 y.o., male MRN:  [id number] DOB:  12/27/1995 Patient phone:  [phone] (home)  Patient address:   [address] [place] [address],  Total Time spent with patient: 30 minutes  Date of Admission:  09/27/2024 Date of Discharge: 10/02/2024  Bobby [name] is a 28 y.o. male  with a past psychiatric history of schizophrenia, hospitalizations in 2018 and 2019 for psychosis, no past suicide attempts. Patient initially arrived to [facility] on 09/26/2024 for involuntary commitment from grandmother for psychosis, and admitted to [facility] under IVC on 09/27/2024 for acute safety concerns, impaired functioning, intensive therapeutic interventions, and stabilization of acute on chronic psychiatric conditions.   Principal Problem: Schizophrenia [facility]) Discharge Diagnoses: Principal Problem:   Schizophrenia (HCC)   Past Psychiatric Hx: Current Psychiatrist: [facility] per chart review Current Therapist: None Previous Psychiatric Diagnoses: Schizophrenia per chart review Psychiatric Medications: Current Risperidone  3 mg twice daily Cogentin  0.5 mg twice daily Past Per telemetry psychiatrist note chart review has been on Abilify  Maintena, Cogentin , hydroxyzine , risperidone , trazodone , tegretol  and Seroquel   Psychiatric Hospitalization hx: 2018 and 2019, similar presentation in 2019 and got better in 3 days on risperidone  3 twice daily Psychotherapy hx: None ACT team hx: None Neuromodulation history: None History of suicide: None History of homicide or aggression: None   Substance Use Hx: Patient is unable to provide this history but per chart review and grandmother used cannabis 2 weeks ago with sister.  UDS positive for cannabis.   Past Medical History: Unable to provide this information.  No pertinent medical issues per grandmother.   Family History: family history is not on file.  No pertinent per grandmother.    Social History: Developmental: Developed schizophrenia when he was 28 years old after using an unknown substance.  Grandmother has been primary caretaker since that time. Current Living Situation: Grandmother in [place]. Education: 12th grade but did not finish high school. Occupation: On disability and not working. Hobbies: None because I am disabled   Legal: denies any upcoming court dates.  Access to firearms: Denied per patient and collateral    Hospital course: Hadn't been taking his medications due to side effects particuarlly feeling tired. After long discussion of his impairment from untreated schizo, he was open to getting back on treatment and per request of GMA was transitioned to LAI, risepridone 125 mg once 28 days (equivalent to risperdal  5mg  daily). He tolerated without side effects. Regarding presentation, he was very confused and disorganized on admission. He had a lot of thought blocking. He was not clearly RTIS but did seem preoccupied. He had gradual improvement of his thought process and was linear and logical by day 3/4. Tolerated LAI for 48 hours prior to discharge w/o side effects. No oral bridge required but recommending he continues the cogentin  0.5 twice daily as historical on this for EPS but defer to outpatient about whether this was treating acutal EPS or was just prophylaxis. Checked and confirmed that insurance covers Uzedy  with a $4 copay. No EPS including TD (AIMS 0) noted at discharge. Pt has significant negative symptoms and spent most days learning in bed almost all day.   During the patient's hospitalization, patient had extensive initial psychiatric evaluation, and follow-up psychiatric evaluations every day.  Psychiatric diagnoses provided upon initial assessment:  Schizophrenia  Pysch meds Rstarted risperidone  3 mg bid prior to admission in ED so continued 3 mg bid on admission and tolerated okay and was  transitioned to Uzedy  125 mg LAI once every  28 days (received 09/30/2024, next dose 10/28/2024) Continued cogentin  0.5 bid for EPS   Patient's care was discussed during the interdisciplinary team meeting every day during the hospitalization.  The patient is not having side effects to prescribed psychiatric medication.  Gradually, patient started adjusting to milieu. The patient was evaluated each day by a clinical provider to ascertain response to treatment. Improvement was noted by the patient's report of decreasing symptoms, improved sleep and appetite, affect, medication tolerance, behavior, and participation in unit programming.  Patient was asked each day to complete a self inventory noting mood, mental status, pain, new symptoms, anxiety and concerns.   Symptoms were reported as significantly decreased or resolved completely by discharge.  The patient reports that their mood is stable.  The patient denied having suicidal thoughts for more than 48 hours prior to discharge.  Patient denies having homicidal thoughts.  Patient denies having auditory hallucinations.  Patient denies any visual hallucinations or other symptoms of psychosis.  The patient was motivated to continue taking medication with a goal of continued improvement in mental health.   The patient reports their target psychiatric symptoms of psychosis responded well to the psychiatric medications, and the patient reports overall benefit other psychiatric hospitalization. Supportive psychotherapy was provided to the patient. The patient also participated in regular group therapy while hospitalized. Coping skills, problem solving as well as relaxation therapies were also part of the unit programming.  Labs were reviewed with the patient, and abnormal results were discussed with the patient.  The patient is able to verbalize their individual safety plan to this provider.  # It is recommended to the patient to continue psychiatric medications as prescribed, after discharge from  the hospital.    # It is recommended to the patient to follow up with your outpatient psychiatric provider and PCP.  # It was discussed with the patient, the impact of alcohol, drugs, tobacco have been there overall psychiatric and medical wellbeing, and total abstinence from substance use was recommended the patient.ed.  # Prescriptions provided or sent directly to preferred pharmacy at discharge. Patient agreeable to plan. Given opportunity to ask questions. Appears to feel comfortable with discharge.    # In the event of worsening symptoms, the patient is instructed to call the crisis hotline, 911 and or go to the nearest ED for appropriate evaluation and treatment of symptoms. To follow-up with primary care provider for other medical issues, concerns and or health care needs  # Patient was discharged home with grandma with a plan to follow up as noted below.    On day of discharge denied SI, HI, AVH, paranoia. Reported no side effects to meds including excessive sedation. Pt denies extrapyramidal symptoms including dystonia (sudden spastic contractions of muscle groups), parkinsonism (bradykinesia, tremors, rigidity), and akathisia (severe restlessness). Excisted to go back to grandmas. Sleeping well. Reported not eating much but at his baseline.    Physical Findings: AIMS: Facial and Oral Movements Muscles of Facial Expression: None Lips and Perioral Area: None Jaw: None Tongue: None,Extremity Movements Upper (arms, wrists, hands, fingers): None Lower (legs, knees, ankles, toes): None, Trunk Movements Neck, shoulders, hips: None, Global Judgements Severity of abnormal movements overall : None Incapacitation due to abnormal movements: None Patient's awareness of abnormal movements: No Awareness, Dental Status Current problems with teeth and/or dentures?: No Does patient usually wear dentures?: No Edentia?: No, Other Do movements disappear in sleep?: No, AIMS Total Score AIMS  Total  Score: 0   Musculoskeletal: Strength & Muscle Tone: within normal limits Gait & Station: normal Patient leans: N/A   Psychiatric Specialty Exam:  Presentation  General Appearance:  Appropriate for Environment  Eye Contact: Good  Speech: Normal Rate  Speech Volume: Normal  Handedness:No data recorded  Mood and Affect  Mood: Euthymic  Affect: Appropriate; Congruent; Full Range   Thought Process  Thought Processes: Coherent; Linear  Descriptions of Associations:Intact  Orientation:Full (Time, Place and Person)  Thought Content:Logical  History of Schizophrenia/Schizoaffective disorder:No data recorded Duration of Psychotic Symptoms:No data recorded Hallucinations:Hallucinations: None  Ideas of Reference:None  Suicidal Thoughts:Suicidal Thoughts: No  Homicidal Thoughts:Homicidal Thoughts: No   Sensorium  Memory: Immediate Good; Recent Good; Remote Good  Judgment: Good  Insight: Good   Executive Functions  Concentration: Good  Attention Span: Good  Recall: Good  Fund of Knowledge: Good  Language: Good   Psychomotor Activity  Psychomotor Activity: Psychomotor Activity: Normal   Assets  Assets: Desire for Improvement; Housing; Social Support   Sleep  Sleep: Sleep: Good  Estimated Sleeping Duration (Last 24 Hours): 6.50-8.25 hours   Physical Exam: Physical Exam Vitals and nursing note reviewed.  HENT:     Head: Normocephalic and atraumatic.  Pulmonary:     Effort: Pulmonary effort is normal.  Neurological:     General: No focal deficit present.     Mental Status: He is alert.  Psychiatric:     Comments: No obvious EPS.    Review of Systems  Constitutional:  Negative for fever.  Cardiovascular:  Negative for chest pain and palpitations.  Gastrointestinal:  Negative for constipation, diarrhea, nausea and vomiting.  Neurological:  Negative for dizziness, weakness and headaches.  Psychiatric/Behavioral:          Pt denies extrapyramidal symptoms including dystonia (sudden spastic contractions of muscle groups), parkinsonism (bradykinesia, tremors, rigidity), and akathisia (severe restlessness).    Blood pressure 130/82, pulse 81, temperature (!) 97.5 F (36.4 C), temperature source Oral, resp. rate 16, height 5' 9 (1.753 m), weight 110 kg, SpO2 100%. Body mass index is 35.83 kg/m.   Social History   Tobacco Use  Smoking Status Never  Smokeless Tobacco Never   Tobacco Cessation:  N/A, patient does not currently use tobacco products   Blood Alcohol level:  Lab Results  Component Value Date   [facility] <15 09/26/2024   ETH <10 12/01/2018    Metabolic Disorder Labs:  Lab Results  Component Value Date   HGBA1C 5.6 08/27/2024   MPG 111 07/18/2017   MPG 103 07/17/2017   Lab Results  Component Value Date   PROLACTIN 51.8 (H) 07/18/2017   PROLACTIN 36.8 (H) 07/17/2017   Lab Results  Component Value Date   CHOL 203 (H) 01/30/2024   TRIG 246 (H) 01/30/2024   HDL 32 (L) 01/30/2024   CHOLHDL 6.3 (H) 01/30/2024   VLDL 25 07/18/2017   LDLCALC 127 (H) 01/30/2024   LDLCALC 117 (H) 01/24/2023    See Psychiatric Specialty Exam and Suicide Risk Assessment completed by Attending Physician prior to discharge.  Discharge destination:  Home  Is patient on multiple antipsychotic therapies at discharge:  No      Allergies as of 10/02/2024       Reactions   Bee Pollen Other (See Comments)   Reaction unknown         Medication List     STOP taking these medications    albuterol  108 (90 Base) MCG/ACT inhaler Commonly known as: VENTOLIN  HFA  hydrocortisone  2.5 % rectal cream Commonly known as: Anusol -HC   hydrOXYzine  25 MG tablet Commonly known as: ATARAX    risperiDONE  3 MG tablet Commonly known as: RISPERDAL  Replaced by: Uzedy  125 MG/0.35ML Susy   traZODone  50 MG tablet Commonly known as: DESYREL    Vitamin D -3 125 MCG (5000 UT) Tabs       TAKE these  medications      Indication  benztropine  0.5 MG tablet Commonly known as: COGENTIN  Take 1 tablet (0.5 mg total) by mouth 2 (two) times daily. What changed: additional instructions  Indication: Extrapyramidal Reaction caused by Medications   Uzedy  125 MG/0.35ML Susy Generic drug: risperiDONE  ER Inject 0.35 mLs (125 mg total) into the skin every 28 (twenty-eight) days. Start taking on: October 28, 2024 Replaces: risperiDONE  3 MG tablet  Indication: Schizophrenia        Follow-up Information     Services, [facility]. Go on 10/05/2024.   Why: You have a hospital follow up appointment for therapy and medication management services on 10/05/24 at 10:00 am.  The appointment will be held in person. Contact information: [address] [place] [address] [phone]                 Follow-up recommendations:  Activity: as tolerated  Diet: heart healthy  Other: -Follow-up with your outpatient psychiatric provider -instructions on appointment date, time, and address (location) are provided to you in discharge paperwork.  -Take your psychiatric medications as prescribed at discharge - instructions are provided to you in the discharge paperwork  -Follow-up with outpatient primary care doctor and other specialists -for management of chronic medical disease, including: n/a  -Testing: Follow-up with outpatient provider for abnormal lab results: n/a  -Recommend abstinence from alcohol, tobacco, and other illicit drug use at discharge.   -If your psychiatric symptoms recur, worsen, or if you have side effects to your psychiatric medications, call your outpatient psychiatric provider, 911, 988 or go to the nearest emergency department.  -If suicidal thoughts recur, call your outpatient psychiatric provider, 911, 988 or go to the nearest emergency department.   Signed: Justino Cornish, MD 10/02/2024, 9:06 AM

## 2024-10-02 NOTE — Discharge Instructions (Signed)
-  Follow-up with your outpatient psychiatric provider (and therapist) -instructions on appointment date, time, and address (location) are provided to you in discharge paperwork.  -Take your psychiatric medications as prescribed at discharge - instructions are provided to you in the discharge paperwork  -Follow-up with outpatient primary care doctor and other specialists -for management of preventative medicine and any chronic medical disease.  -Recommend abstinence from alcohol, tobacco, cannabis, and other substances at discharge.   -If your psychiatric symptoms recur, worsen, or if you have severe side effects to your psychiatric medications, call your outpatient psychiatric provider, 911, 988 (national suicide hotline), go to [facility], or go to the nearest emergency department.  -If suicidal thoughts occur, call your outpatient psychiatric provider, 911, 988 (national suicide hotline), go to [facility], or go to the nearest emergency department.  Naloxone (Narcan) can help reverse an overdose when given to the victim quickly.  [facility] offers free naloxone kits and instructions/training on its use.  Add naloxone to your first aid kit and you can help save a life.   Pick up your free kit at the following locations:   [place]:  [facility], [address] [place] [address] [phone]) [facility] [address] [place] [address] [phone]) [facility] [address] [place] [address]  [place]: [facility] [address] [place] [address] ([phone]) [facility] [address] [place] [address] [phone])

## 2024-10-02 NOTE — Plan of Care (Signed)
   Problem: Education: Goal: Emotional status will improve Outcome: Progressing Goal: Mental status will improve Outcome: Progressing Goal: Verbalization of understanding the information provided will improve Outcome: Progressing

## 2024-10-02 NOTE — Progress Notes (Signed)
 Pt discharged to lobby to his Grandmother. Pt was stable and appreciative at that time. All papers and prescriptions were given and valuables returned. Verbal understanding expressed. Denies SI/HI and A/VH. Pt given opportunity to express concerns and ask questions.

## 2024-10-02 NOTE — Care Management Important Message (Signed)
 Patient informed of right to appeal discharge, provided phone number to [facility]. Patient expressed no interest in appealing discharge at this time. CSW will continue to monitor situation.

## 2024-10-02 NOTE — [facility] Suicide Risk Assessment (Signed)
 Suicide Risk Assessment  Discharge Assessment    [facility] Discharge Suicide Risk Assessment   Principal Problem: Schizophrenia [facility]) Discharge Diagnoses: Principal Problem:   Schizophrenia (HCC)   Total Time spent with patient: 1 hour  Musculoskeletal: Strength & Muscle Tone: within normal limits Gait & Station: normal Patient leans: N/A  Psychiatric Specialty Exam  Presentation  General Appearance:  Appropriate for Environment  Eye Contact: Good  Speech: Normal Rate  Speech Volume: Normal  Handedness:No data recorded  Mood and Affect  Mood: Euthymic  Duration of Depression Symptoms: No data recorded Affect: Appropriate; Congruent; Full Range   Thought Process  Thought Processes: Coherent; Linear  Descriptions of Associations:Intact  Orientation:Full (Time, Place and Person)  Thought Content:Logical  History of Schizophrenia/Schizoaffective disorder:No data recorded Duration of Psychotic Symptoms:No data recorded Hallucinations:Hallucinations: None  Ideas of Reference:None  Suicidal Thoughts:Suicidal Thoughts: No  Homicidal Thoughts:Homicidal Thoughts: No   Sensorium  Memory: Immediate Good; Recent Good; Remote Good  Judgment: Good  Insight: Good   Executive Functions  Concentration: Good  Attention Span: Good  Recall: Good  Fund of Knowledge: Good  Language: Good   Psychomotor Activity  Psychomotor Activity: Psychomotor Activity: Normal   Assets  Assets: Desire for Improvement; Housing; Social Support   Sleep  Sleep: Sleep: Good  Estimated Sleeping Duration (Last 24 Hours): 6.75-8.50 hours  Physical Exam: Physical Exam Vitals and nursing note reviewed.  HENT:     Head: Normocephalic and atraumatic.  Pulmonary:     Effort: Pulmonary effort is normal.  Neurological:     General: No focal deficit present.     Mental Status: He is alert.  Psychiatric:     Comments: No obvious EPS.    Review of Systems   Constitutional:  Negative for fever.  Cardiovascular:  Negative for chest pain and palpitations.  Gastrointestinal:  Negative for constipation, diarrhea, nausea and vomiting.  Neurological:  Negative for dizziness, weakness and headaches.  Psychiatric/Behavioral:         Pt denies extrapyramidal symptoms including dystonia (sudden spastic contractions of muscle groups), parkinsonism (bradykinesia, tremors, rigidity), and akathisia (severe restlessness).    Blood pressure 130/82, pulse 81, temperature (!) 97.5 F (36.4 C), temperature source Oral, resp. rate 16, height 5' 9 (1.753 m), weight 110 kg, SpO2 100%. Body mass index is 35.83 kg/m.  Mental Status Per Nursing Assessment::   On Admission:  NA  Demographic Factors:  Male and Unemployed  Loss Factors: NA  Historical Factors: NA  Risk Reduction Factors:   Sense of responsibility to family, Living with another person, especially a relative, Positive social support, and Positive therapeutic relationship  Continued Clinical Symptoms:  Mood is stable. Anxiety at a manageable level. Denying any SI including passive SI.   Cognitive Features That Contribute To Risk:  None    Suicide Risk:  Mild:  There are no identifiable suicide plans, no associated intent, mild dysphoria and related symptoms, good self-control (both objective and subjective assessment), few other risk factors, and identifiable protective factors, including available and accessible social support.   Follow-up Information     [facility], [facility]. Go on 10/05/2024.   Why: You have a hospital follow up appointment for therapy and medication management services on 10/05/24 at 10:00 am.  The appointment will be held in person. Contact information: [address] [place] [address] [phone]                 Plan Of Care/Follow-up recommendations:  Activity: as tolerated  Diet:  heart healthy  Other: -Follow-up with your outpatient  psychiatric provider -instructions on appointment date, time, and address (location) are provided to you in discharge paperwork.  -Take your psychiatric medications as prescribed at discharge - instructions are provided to you in the discharge paperwork  -Follow-up with outpatient primary care doctor and other specialists -for management of chronic medical disease, including: n/a  -Testing: Follow-up with outpatient provider for abnormal lab results: n/a  -Recommend abstinence from alcohol, tobacco, and other illicit drug use at discharge.   -If your psychiatric symptoms recur, worsen, or if you have side effects to your psychiatric medications, call your outpatient psychiatric provider, 911, 988 or go to the nearest emergency department.  -If suicidal thoughts recur, call your outpatient psychiatric provider, 911, [phone] or go to the nearest emergency department.   Justino Cornish, MD 10/02/2024, 9:17 AM

## 2024-10-02 NOTE — Progress Notes (Signed)
 Sleep Hours) - 9.5 (Any PRNs that were needed, meds refused, or side effects to meds)- None (Any disturbances and when (visitation, over night)- None (Concerns raised by the patient)- None (SI/HI/AVH)- None

## 2024-10-26 ENCOUNTER — Encounter: Payer: Self-pay | Admitting: Radiology

## 2025-01-19 ENCOUNTER — Telehealth: Payer: Self-pay

## 2025-01-19 NOTE — Telephone Encounter (Signed)
 Copied from CRM #[id number]. Topic: Clinical - Prescription Issue >> Jan 19, 2025  3:27 PM Lonell PEDLAR wrote: Reason for CRM: Patient's grandmother called, pharmacy is requesting that we submit a formulary exception to patient's insurance or request generic form of rx for risperiDONE  ER (UZEDY ) 125 MG/0.35ML SUSY  Patient is due for his injection on 01/21/2025  Please review and contact patient's grandmother of next steps [phone]

## 2025-01-20 MED ORDER — UZEDY 125 MG/0.35ML ~~LOC~~ SUSY: 125.0000 mg | PREFILLED_SYRINGE | SUBCUTANEOUS | 0 refills | Status: AC

## 2025-01-20 NOTE — Telephone Encounter (Signed)
 Script sent to [facility] for dispense as written pls follow through to see that she can get med , if not pls let me kniow

## 2025-01-20 NOTE — Addendum Note (Signed)
 Addended by: ANTONETTA ROLLENE BRAVO on: 01/20/2025 02:36 PM   Modules accepted: Orders

## 2025-01-20 NOTE — Telephone Encounter (Signed)
 Grandmother advised.

## 2025-01-21 ENCOUNTER — Telehealth: Payer: Self-pay | Admitting: Pharmacy Technician

## 2025-01-21 ENCOUNTER — Other Ambulatory Visit (HOSPITAL_COMMUNITY): Payer: Self-pay

## 2025-01-21 NOTE — Telephone Encounter (Signed)
 Pharmacy Patient Advocate Encounter   Received notification from [facility] that prior authorization for Uzedy  125mg  is required/requested.   Insurance verification completed.   The patient is insured through [facility].   Per test claim:  Invega  Sustenna is preferred by the insurance.  If suggested medication is appropriate, Please send in a new RX and discontinue this one. If not, please advise as to why it's not appropriate so that we may request a Prior Authorization. Please note, some preferred medications may still require a PA.  If the suggested medications have not been trialed and there are no contraindications to their use, the PA will not be submitted, as it will not be approved. Archived Key: [id number] The invega  would be $4.90.

## 2025-01-22 NOTE — Telephone Encounter (Signed)
 Left detailed vm

## 2025-01-26 NOTE — Telephone Encounter (Signed)
 Left detailed vm

## 2025-01-27 ENCOUNTER — Other Ambulatory Visit (HOSPITAL_COMMUNITY): Payer: Self-pay

## 2025-02-24 ENCOUNTER — Encounter: Admitting: Internal Medicine
